# Patient Record
Sex: Male | Born: 1965 | Race: White | Hispanic: No | Marital: Single | Smoking: Never smoker
Health system: Southern US, Community
[De-identification: ages and names within clinical notes are randomized; demographics above are authoritative.]

## PROBLEM LIST (undated history)

## (undated) DIAGNOSIS — Q210 Ventricular septal defect: Secondary | ICD-10-CM

## (undated) DIAGNOSIS — G459 Transient cerebral ischemic attack, unspecified: Secondary | ICD-10-CM

## (undated) DIAGNOSIS — Q909 Down syndrome, unspecified: Secondary | ICD-10-CM

## (undated) DIAGNOSIS — I272 Pulmonary hypertension, unspecified: Secondary | ICD-10-CM

## (undated) DIAGNOSIS — I2510 Atherosclerotic heart disease of native coronary artery without angina pectoris: Secondary | ICD-10-CM

---

## 2017-10-16 ENCOUNTER — Inpatient Hospital Stay (HOSPITAL_BASED_OUTPATIENT_CLINIC_OR_DEPARTMENT_OTHER)
Admission: EM | Admit: 2017-10-16 | Discharge: 2017-10-20 | DRG: 291 | Disposition: A | Discharge status: Home / Self Care | Payer: Medicare Other | Attending: Internal Medicine | Admitting: Internal Medicine

## 2017-10-16 ENCOUNTER — Emergency Department (HOSPITAL_BASED_OUTPATIENT_CLINIC_OR_DEPARTMENT_OTHER): Payer: Medicare Other

## 2017-10-16 ENCOUNTER — Other Ambulatory Visit: Payer: Self-pay

## 2017-10-16 ENCOUNTER — Encounter (HOSPITAL_BASED_OUTPATIENT_CLINIC_OR_DEPARTMENT_OTHER): Payer: Self-pay | Admitting: *Deleted

## 2017-10-16 DIAGNOSIS — Z66 Do not resuscitate: Secondary | ICD-10-CM | POA: Diagnosis present

## 2017-10-16 DIAGNOSIS — F329 Major depressive disorder, single episode, unspecified: Secondary | ICD-10-CM | POA: Diagnosis present

## 2017-10-16 DIAGNOSIS — I959 Hypotension, unspecified: Secondary | ICD-10-CM | POA: Diagnosis present

## 2017-10-16 DIAGNOSIS — I509 Heart failure, unspecified: Secondary | ICD-10-CM

## 2017-10-16 DIAGNOSIS — I2510 Atherosclerotic heart disease of native coronary artery without angina pectoris: Secondary | ICD-10-CM | POA: Diagnosis present

## 2017-10-16 DIAGNOSIS — Q909 Down syndrome, unspecified: Secondary | ICD-10-CM

## 2017-10-16 DIAGNOSIS — R32 Unspecified urinary incontinence: Secondary | ICD-10-CM | POA: Diagnosis not present

## 2017-10-16 DIAGNOSIS — K219 Gastro-esophageal reflux disease without esophagitis: Secondary | ICD-10-CM | POA: Diagnosis present

## 2017-10-16 DIAGNOSIS — Z9981 Dependence on supplemental oxygen: Secondary | ICD-10-CM | POA: Diagnosis not present

## 2017-10-16 DIAGNOSIS — Z8673 Personal history of transient ischemic attack (TIA), and cerebral infarction without residual deficits: Secondary | ICD-10-CM

## 2017-10-16 DIAGNOSIS — E039 Hypothyroidism, unspecified: Secondary | ICD-10-CM | POA: Diagnosis present

## 2017-10-16 DIAGNOSIS — J9601 Acute respiratory failure with hypoxia: Secondary | ICD-10-CM | POA: Diagnosis not present

## 2017-10-16 DIAGNOSIS — I2783 Eisenmenger's syndrome: Secondary | ICD-10-CM | POA: Diagnosis present

## 2017-10-16 DIAGNOSIS — Q211 Atrial septal defect: Secondary | ICD-10-CM | POA: Diagnosis not present

## 2017-10-16 DIAGNOSIS — I110 Hypertensive heart disease with heart failure: Principal | ICD-10-CM | POA: Diagnosis present

## 2017-10-16 DIAGNOSIS — D696 Thrombocytopenia, unspecified: Secondary | ICD-10-CM | POA: Diagnosis present

## 2017-10-16 DIAGNOSIS — G4733 Obstructive sleep apnea (adult) (pediatric): Secondary | ICD-10-CM | POA: Diagnosis present

## 2017-10-16 DIAGNOSIS — I5033 Acute on chronic diastolic (congestive) heart failure: Secondary | ICD-10-CM | POA: Diagnosis present

## 2017-10-16 DIAGNOSIS — Z7901 Long term (current) use of anticoagulants: Secondary | ICD-10-CM

## 2017-10-16 DIAGNOSIS — J9600 Acute respiratory failure, unspecified whether with hypoxia or hypercapnia: Secondary | ICD-10-CM

## 2017-10-16 DIAGNOSIS — Z79899 Other long term (current) drug therapy: Secondary | ICD-10-CM | POA: Diagnosis not present

## 2017-10-16 DIAGNOSIS — J9690 Respiratory failure, unspecified, unspecified whether with hypoxia or hypercapnia: Secondary | ICD-10-CM

## 2017-10-16 DIAGNOSIS — J9620 Acute and chronic respiratory failure, unspecified whether with hypoxia or hypercapnia: Secondary | ICD-10-CM | POA: Diagnosis not present

## 2017-10-16 DIAGNOSIS — J811 Chronic pulmonary edema: Secondary | ICD-10-CM

## 2017-10-16 DIAGNOSIS — J810 Acute pulmonary edema: Secondary | ICD-10-CM | POA: Diagnosis not present

## 2017-10-16 DIAGNOSIS — Q210 Ventricular septal defect: Secondary | ICD-10-CM | POA: Diagnosis not present

## 2017-10-16 DIAGNOSIS — D72819 Decreased white blood cell count, unspecified: Secondary | ICD-10-CM | POA: Diagnosis present

## 2017-10-16 DIAGNOSIS — J9621 Acute and chronic respiratory failure with hypoxia: Secondary | ICD-10-CM | POA: Diagnosis present

## 2017-10-16 DIAGNOSIS — Z9989 Dependence on other enabling machines and devices: Secondary | ICD-10-CM | POA: Diagnosis not present

## 2017-10-16 DIAGNOSIS — I272 Pulmonary hypertension, unspecified: Secondary | ICD-10-CM | POA: Diagnosis present

## 2017-10-16 DIAGNOSIS — I361 Nonrheumatic tricuspid (valve) insufficiency: Secondary | ICD-10-CM | POA: Diagnosis not present

## 2017-10-16 DIAGNOSIS — R600 Localized edema: Secondary | ICD-10-CM | POA: Diagnosis not present

## 2017-10-16 HISTORY — DX: Transient cerebral ischemic attack, unspecified: G45.9

## 2017-10-16 HISTORY — DX: Atherosclerotic heart disease of native coronary artery without angina pectoris: I25.10

## 2017-10-16 HISTORY — DX: Ventricular septal defect: Q21.0

## 2017-10-16 HISTORY — DX: Pulmonary hypertension, unspecified: I27.20

## 2017-10-16 HISTORY — DX: Down syndrome, unspecified: Q90.9

## 2017-10-16 LAB — COMPREHENSIVE METABOLIC PANEL
ALT: 11 U/L (ref 0–44)
AST: 21 U/L (ref 15–41)
Albumin: 3.6 g/dL (ref 3.5–5.0)
Alkaline Phosphatase: 36 U/L — ABNORMAL LOW (ref 38–126)
Anion gap: 7 (ref 5–15)
BILIRUBIN TOTAL: 0.9 mg/dL (ref 0.3–1.2)
BUN: 20 mg/dL (ref 6–20)
CALCIUM: 7.9 mg/dL — AB (ref 8.9–10.3)
CO2: 29 mmol/L (ref 22–32)
CREATININE: 1.4 mg/dL — AB (ref 0.61–1.24)
Chloride: 106 mmol/L (ref 98–111)
GFR, EST NON AFRICAN AMERICAN: 57 mL/min — AB (ref >60)
Glucose, Bld: 95 mg/dL (ref 70–99)
POTASSIUM: 3.5 mmol/L (ref 3.5–5.1)
Sodium: 142 mmol/L (ref 135–145)
Total Protein: 6.6 g/dL (ref 6.5–8.1)

## 2017-10-16 LAB — CBC WITH DIFFERENTIAL/PLATELET
Basophils Absolute: 0 10*3/uL (ref 0.0–0.1)
Basophils Relative: 0 %
EOS PCT: 1 %
Eosinophils Absolute: 0 10*3/uL (ref 0.0–0.7)
HEMATOCRIT: 44.7 % (ref 39.0–52.0)
Hemoglobin: 14.7 g/dL (ref 13.0–17.0)
LYMPHS ABS: 0.7 10*3/uL (ref 0.7–4.0)
LYMPHS PCT: 18 %
MCH: 30.5 pg (ref 26.0–34.0)
MCHC: 32.9 g/dL (ref 30.0–36.0)
MCV: 92.7 fL (ref 78.0–100.0)
MONO ABS: 0.4 10*3/uL (ref 0.1–1.0)
MONOS PCT: 10 %
Neutro Abs: 2.6 10*3/uL (ref 1.7–7.7)
Neutrophils Relative %: 71 %
Platelets: 103 10*3/uL — ABNORMAL LOW (ref 150–400)
RBC: 4.82 MIL/uL (ref 4.22–5.81)
RDW: 16.9 % — AB (ref 11.5–15.5)
WBC: 3.7 10*3/uL — ABNORMAL LOW (ref 4.0–10.5)

## 2017-10-16 LAB — I-STAT VENOUS BLOOD GAS, ED
Acid-Base Excess: 2 mmol/L (ref 0.0–2.0)
BICARBONATE: 28.5 mmol/L — AB (ref 20.0–28.0)
O2 Saturation: 46 %
PCO2 VEN: 52.7 mmHg (ref 44.0–60.0)
TCO2: 30 mmol/L (ref 22–32)
pH, Ven: 7.341 (ref 7.250–7.430)
pO2, Ven: 27 mmHg — CL (ref 32.0–45.0)

## 2017-10-16 LAB — BRAIN NATRIURETIC PEPTIDE: B Natriuretic Peptide: 527.1 pg/mL — ABNORMAL HIGH (ref 0.0–100.0)

## 2017-10-16 LAB — TROPONIN I
Troponin I: 0.03 ng/mL (ref <0.03)
Troponin I: <0.03 ng/mL (ref <0.03)

## 2017-10-16 MED ORDER — TREPROSTINIL 0.6 MG/ML IN SOLN
24.0000 ug | Freq: Four times a day (QID) | RESPIRATORY_TRACT | Status: DC
  Administered 2017-10-16 – 2017-10-17 (×5): 24 ug via RESPIRATORY_TRACT

## 2017-10-16 MED ORDER — HEPARIN (PORCINE) IN NACL 100-0.45 UNIT/ML-% IJ SOLN
1250.0000 [IU]/h | INTRAMUSCULAR | Status: DC
  Administered 2017-10-16: 1200 [IU]/h via INTRAVENOUS
  Administered 2017-10-17: 1250 [IU]/h via INTRAVENOUS
  Filled 2017-10-16 (×2): qty 250

## 2017-10-16 MED ORDER — AMBRISENTAN 5 MG PO TABS
10.0000 mg | ORAL_TABLET | Freq: Every day | ORAL | Status: DC
  Administered 2017-10-17: 10 mg via ORAL

## 2017-10-16 MED ORDER — EPLERENONE 25 MG PO TABS
12.5000 mg | ORAL_TABLET | Freq: Every day | ORAL | Status: DC
  Administered 2017-10-17 – 2017-10-18 (×2): 12.5 mg via ORAL
  Filled 2017-10-16 (×2): qty 1

## 2017-10-16 MED ORDER — SERTRALINE HCL 50 MG PO TABS
50.0000 mg | ORAL_TABLET | Freq: Every day | ORAL | Status: DC
  Administered 2017-10-17 – 2017-10-20 (×4): 50 mg via ORAL
  Filled 2017-10-16 (×4): qty 1

## 2017-10-16 MED ORDER — SELEXIPAG 1600 MCG PO TABS
1600.0000 ug | ORAL_TABLET | Freq: Two times a day (BID) | ORAL | Status: DC
  Administered 2017-10-16 – 2017-10-20 (×8): 1600 ug via ORAL
  Filled 2017-10-16 (×9): qty 1

## 2017-10-16 MED ORDER — LEVOTHYROXINE SODIUM 88 MCG PO TABS
88.0000 ug | ORAL_TABLET | Freq: Every day | ORAL | Status: DC
  Administered 2017-10-17 – 2017-10-20 (×4): 88 ug via ORAL
  Filled 2017-10-16 (×5): qty 1

## 2017-10-16 MED ORDER — PANTOPRAZOLE SODIUM 40 MG PO TBEC
40.0000 mg | DELAYED_RELEASE_TABLET | Freq: Every day | ORAL | Status: DC
  Administered 2017-10-17 – 2017-10-20 (×4): 40 mg via ORAL
  Filled 2017-10-16 (×4): qty 1

## 2017-10-16 MED ORDER — NEBIVOLOL HCL 2.5 MG PO TABS
1.2500 mg | ORAL_TABLET | Freq: Every day | ORAL | Status: DC
  Administered 2017-10-17: 1.25 mg via ORAL
  Filled 2017-10-16 (×2): qty 1

## 2017-10-16 MED ORDER — FUROSEMIDE 10 MG/ML IJ SOLN
40.0000 mg | Freq: Once | INTRAMUSCULAR | Status: AC
  Administered 2017-10-16: 40 mg via INTRAVENOUS
  Filled 2017-10-16: qty 4

## 2017-10-16 MED ORDER — RIOCIGUAT 2.5 MG PO TABS
2.5000 mg | ORAL_TABLET | Freq: Three times a day (TID) | ORAL | Status: DC
  Administered 2017-10-16 – 2017-10-20 (×11): 2.5 mg via ORAL
  Filled 2017-10-16 (×11): qty 1

## 2017-10-16 MED ORDER — ALLOPURINOL 300 MG PO TABS
300.0000 mg | ORAL_TABLET | Freq: Every day | ORAL | Status: DC
  Administered 2017-10-17 – 2017-10-20 (×4): 300 mg via ORAL
  Filled 2017-10-16 (×4): qty 1

## 2017-10-16 NOTE — ED Notes (Signed)
ED Provider at bedside. 

## 2017-10-16 NOTE — Progress Notes (Signed)
ANTICOAGULATION CONSULT NOTE - Initial Consult  Pharmacy Consult for Heparin (Xarelto on hold) Indication: Hx of TIA with unrepaired VSD  No Known Allergies  Patient Measurements: Height: _0  (172.7 cm) Weight: 153 lb 7 oz (69.6 kg) IBW/kg (Calculated) : 68.4  Vital Signs: Temp: 98.4 F (36.9 C) (09/02 1724) Temp Source: Oral (09/02 1724) BP: 109/84 (09/02 2155) Pulse Rate: 75 (09/02 2155)  Labs: Recent Labs    10/16/17 1748  HGB 14.7  HCT 44.7  PLT 103*  CREATININE 1.40*  TROPONINI 0.03*    Estimated Creatinine Clearance: 61.1 mL/min (A) (by C-G formula based on SCr of 1.4 mg/dL (H)).  Medical History: Past Medical History:  Diagnosis Date  . Coronary artery stenosis   . Down syndrome   . Pulmonary hypertension ([place])   . TIA (transient ischemic attack)   . Ventricular septal defect    Assessment: 52 y/o M with hx down syndrome, pulm HTN. He is visiting [place] from [place] and presents with heart failure/resp failure. He is on Xarelto PTA for hx of TIA along with unrepaired VSD. Last dose of Xarelto was 9/1 ~2000. It has been >24 hours since the last dose, will plan to start Xarelto now. Will likely be using the aPTT to dose for the next 24-48 hours due to Xarelto influence on anti-Xa levels.   Goal of Therapy:  Heparin level 0.3-0.7 units/ml aPTT 66-102 seconds Monitor platelets by anticoagulation protocol: Yes   Plan:  Start heparin drip at 1200 units/hr 0800 HL/aPTT Daily CBC/HL/aPTT Monitor for bleeding   Narda Bonds 10/16/2017,10:57 PM

## 2017-10-16 NOTE — ED Provider Notes (Signed)
[facility]PART[facility] Provider Note   CSN: [id number] Arrival date & time: 10/16/17  1713     History   Chief Complaint Chief Complaint  Patient presents with  . Leg Swelling    HPI Jimmy King is a 52 y.o. male.  Patient is a 52 year old male with a history of Down syndrome, pulmonary hypertension, coronary artery disease, VSD that was never repaired and prior TIA who is on multiple medications for pulmonary hypertension, 4 L oxygen chronically presenting today with shortness of breath, hypoxia and color change.  Patient's brother is present with him and states that he usually lives in [place] but several times a year he goes to get him and he lives with him for a month at a time.  Brother states when he picked him up on Tuesday he did have swelling in his legs and slightly wet sounding cough.  They drove all night on Tuesday and got back to [place] on Thursday.  In the last 2 days he seemed to have worsening swelling in his leg and abdomen.  Today brother noted he could hear him breathing across the room and he appeared to be having more trouble breathing even though the patient will not complain of anything.  He has not had fever and the cough has only produced frothy sputum.  Patient does take Xarelto for his prior TIA and his known VSD.  He has not had any unilateral leg pain and the swelling has been equal and bilateral.  Brother states that he always runs a low blood pressure for as long as he can remember.  The history is provided by the patient and a caregiver.    Past Medical History:  Diagnosis Date  . Coronary artery stenosis   . Down syndrome   . Pulmonary hypertension ([place])   . TIA (transient ischemic attack)   . Ventricular septal defect     There are no active problems to display for this patient.   History reviewed. No pertinent surgical history.      Home Medications    Prior to Admission medications   Medication Sig Start Date  End Date Taking? Authorizing Provider  allopurinol (ZYLOPRIM) 300 MG tablet Take 300 mg by mouth daily.   Yes [provider]  ambrisentan (LETAIRIS) 10 MG tablet Take by mouth daily.   Yes [provider]  eplerenone (INSPRA) 25 MG tablet Take 12.5 mg by mouth daily.   Yes [provider]  levothyroxine (SYNTHROID, LEVOTHROID) 88 MCG tablet Take 88 mcg by mouth daily before breakfast.   Yes [provider]  nebivolol (BYSTOLIC) 2.5 MG tablet Take 2.5 mg by mouth daily.   Yes [provider]  pantoprazole (PROTONIX) 40 MG tablet Take 40 mg by mouth daily.   Yes [provider]  Riociguat (ADEMPAS) 2.5 MG TABS Take by mouth 3 (three) times daily.   Yes [provider]  Rivaroxaban (XARELTO) 15 MG TABS tablet Take 15 mg by mouth every evening.   Yes [provider]  Selexipag (UPTRAVI) 1600 MCG TABS Take by mouth 2 (two) times daily.   Yes [provider]  sertraline (ZOLOFT) 50 MG tablet Take 50 mg by mouth daily.   Yes [provider]    Family History History reviewed. No pertinent family history.  Social History Social History   Tobacco Use  . Smoking status: Never Smoker  . Smokeless tobacco: Never Used  Substance Use Topics  . Alcohol use: Never  Frequency: Never  . Drug use: Never     Allergies   Patient has no known allergies.   Review of Systems Review of Systems  All other systems reviewed and are negative.    Physical Exam Updated Vital Signs BP (!) 88/52   Pulse 61   Temp 98.4 F (36.9 C) (Oral)   Resp 12   Wt 70.7 kg   SpO2 (!) 89%   Physical Exam  Constitutional: He is oriented to person, place, and time. He appears well-developed and well-nourished. No distress.  HENT:  Head: Normocephalic and atraumatic.  Mouth/Throat: Oropharynx is clear and moist.  Typical facies related to Down syndrome.  Tried blood at the lip  Eyes: Pupils are equal, round, and reactive  to light. Conjunctivae and EOM are normal.  Neck: Normal range of motion. Neck supple.  Cardiovascular: Normal rate, regular rhythm and intact distal pulses.  Murmur heard. Pulmonary/Chest: Accessory muscle usage present. Tachypnea noted. No respiratory distress. He has wheezes. He has rales.  Abdominal: Soft. He exhibits distension. There is no tenderness. There is no rebound and no guarding.  Musculoskeletal: Normal range of motion. He exhibits edema. He exhibits no tenderness.  3+ pitting edema bilaterally to the knee  Neurological: He is alert and oriented to person, place, and time.  Skin: Skin is warm and dry. No rash noted. No erythema.  Cyanotic in the bilateral upper and lower extremities as well as cyanosis around the lips  Psychiatric: He has a normal mood and affect. His behavior is normal.  Awake and alert and cooperative  Nursing note and vitals reviewed.    ED Treatments / Results  Labs (all labs ordered are listed, but only abnormal results are displayed) Labs Reviewed  CBC WITH DIFFERENTIAL/PLATELET - Abnormal; Notable for the following components:      Result Value   WBC 3.7 (*)    RDW 16.9 (*)    Platelets 103 (*)    All other components within normal limits  COMPREHENSIVE METABOLIC PANEL - Abnormal; Notable for the following components:   Creatinine, Ser 1.40 (*)    Calcium 7.9 (*)    Alkaline Phosphatase 36 (*)    GFR calc non Af Amer 57 (*)    All other components within normal limits  BRAIN NATRIURETIC PEPTIDE - Abnormal; Notable for the following components:   B Natriuretic Peptide 527.1 (*)    All other components within normal limits  TROPONIN I - Abnormal; Notable for the following components:   Troponin I 0.03 (*)    All other components within normal limits  I-STAT VENOUS BLOOD GAS, ED - Abnormal; Notable for the following components:   pO2, Ven 27.0 (*)    Bicarbonate 28.5 (*)    All other components within normal limits    EKG EKG  Interpretation  Date/Time:  Monday October 16 2017 17:41:09 EDT Ventricular Rate:  76 PR Interval:    QRS Duration: 102 QT Interval:  457 QTC Calculation: 494 R Axis:   160 Text Interpretation:  Sinus rhythm Ventricular premature complex Borderline prolonged PR interval Left atrial enlargement Right axis deviation Low voltage, extremity leads Abnormal R-wave progression, early transition Borderline prolonged QT interval No previous tracing Confirmed by Blanchie Dessert [phone]) on 10/16/2017 6:16:01 PM   Radiology Dg Chest Portable 1 View  Result Date: 10/16/2017 CLINICAL DATA:  Shortness of breath and leg swelling. EXAM: PORTABLE CHEST 1 VIEW COMPARISON:  None. FINDINGS: Cardiomegaly. Increased interstitial opacities bilaterally. Atelectasis in the right base.  Probable small effusions. No other acute abnormalities. IMPRESSION: The constellation of findings is most consistent with cardiomegaly, small effusions, and pulmonary edema. Electronically Signed   By: Dorise Bullion III M.D   On: 10/16/2017 18:01    Procedures Procedures (including critical care time)  Medications Ordered in ED Medications  furosemide (LASIX) injection 40 mg (40 mg Intravenous Given 10/16/17 1910)     Initial Impression / Assessment and Plan / ED Course  I have reviewed the triage vital signs and the nursing notes.  Pertinent labs & imaging results that were available during my care of the patient were reviewed by me and considered in my medical decision making (see chart for details).     52 year old patient with Down syndrome unrepaired VSD with a history of pulmonary hypertension on 4 L of oxygen chronically presenting today with hypoxia and evidence of fluid overload and tachypnea.  Patient has a bilateral lower extremity edema as well as abdominal distention.  He has rales and wheezes on chest exam.  X-ray shows evidence of cardiomegaly and pulmonary edema.  BNP elevated at 530.  Patient's renal function  is intact with a creatinine of 1.4 normal electrolytes.  VBG shows a compensated respiratory acidosis.  Troponin is mildly elevated at 0.03.  Patient was started on BiPAP with improvement in oxygen saturation and work of breathing.  Oxygen saturation remains about 92%.  Work of breathing significantly improved.  Brother states that patient's blood pressure is always in the 80s and 90s as long as he can remember.  Here blood pressure has been between the high 17T and 199 systolic.  On reevaluation patient is awake alert and looking much more comfortable.  Cyanosis has resolved.  Initially on evaluation on his 4 L of oxygen patient's O2 sat was 79%.  Patient will be given IV Lasix.  At this time low suspicion for PE or infection.  Patient does take Xarelto directly. Will discuss with cardiology.   Spoke with Dr. Johnsie Cancel and he recommended ICU for end stage pulm hypertension.  CRITICAL CARE Performed by: Khristian Phillippi Total critical care time: 30 minutes Critical care time was exclusive of separately billable procedures and treating other patients. Critical care was necessary to treat or prevent imminent or life-threatening deterioration. Critical care was time spent personally by me on the following activities: development of treatment plan with patient and/or surrogate as well as nursing, discussions with consultants, evaluation of patient's response to treatment, examination of patient, obtaining history from patient or surrogate, ordering and performing treatments and interventions, ordering and review of laboratory studies, ordering and review of radiographic studies, pulse oximetry and re-evaluation of patient's condition.   Final Clinical Impressions(s) / ED Diagnoses   Final diagnoses:  Pulmonary hypertension ([place])  Acute on chronic congestive heart failure, unspecified heart failure type (HCC)  Hypotension, unspecified hypotension type    ED Discharge Orders    None       Blanchie Dessert, MD 10/16/17 2009

## 2017-10-16 NOTE — ED Notes (Signed)
RT at bedside, placed on BiPap

## 2017-10-16 NOTE — Progress Notes (Signed)
Patient just arrived to unit. RT placed patient on BIPAP. Patient tolerating well at this time. RT will continue to monitor as needed.

## 2017-10-16 NOTE — ED Notes (Signed)
Date and time results received: 10/16/17 6:39 PM   Test: Troponin Critical Value: 0.03  Name of Provider Notified: [place]  Orders Received? Or Actions Taken?: MD notified

## 2017-10-16 NOTE — ED Triage Notes (Signed)
Pt visiting from [place] with his brother. Pts brother reports that pts legs are swollen, distended abdomen and 'wet breathing'.

## 2017-10-16 NOTE — ED Notes (Signed)
Playing a game on cell phone , brother at bedside

## 2017-10-16 NOTE — H&P (Addendum)
Jimmy King  CBJ:[id number] DOB: 02/19/1965 DOA: 10/16/2017 PCP: System, Pcp Not In    LOS: 0 days   Reason for Consult / Chief Complaint:  Hypoxic respiratory failure and hypervolemia   Consulting MD and date of consult:  Dr. Maryan Rued 10/16/2017  HPI/summary of hospital stay:  HPI obtained from medical chart review, patient's brother, and medical records brought by patient's brother from [place], 65.    52 year old male visiting brother from [place] with [place] of Down's syndrome, pulmonary hypertension on home O2 4L, CAD, VSD (never repaired), OSA on home BiPAP, hypothyroidism, gout, and TIA (on Xarelto) who presented to [facility] ED with complaints of swollen legs, distended abdomen, wet cough, and shortness of breath.  His brother picked him up in [place] on Tuesday and noted he had some swelling and wet cough at that time but since has progressed.  His brother also states patient chronically has low blood pressure with SBP in the 80-90's.  Denies fever.  States patient never complains.  No reports of bleeding except for busted lip in which his brother states wont heal because patient keeps picking at.  Patient's is primarily managed by Dr. Leane Para in [place], [place] [phone]).   In the ER, he was noted to be tachypneic, afebrile, above average SBP for patient, sats 79% on 4L, and physical exam with evidence of fluid overload.  Labs noted for WBC 3.7, Hgb 14.7, Plts 103, sCr 1.40 (baseline 1.5), BNP 527, troponin 0.03, CXR showing pulmonary edema and cardiomegaly.   VBG noted at 7.341/ 52.7/ 27/ 28.5.  He was given lasix and placed on BiPAP with improvement in work of breathing and saturation.  He is to be transferred to [facility] for further evaluation and management, PCCM to admit.   Subjective  No complaints per patient Arrived on 10/5 BiPAP, 50%  Assessment & Plan:  Severe Pulmonary Hypertension (presumed WHO group 1) with acute volume overload  Hx VSD (not repaired) - on xarelto, letarisis  50m qd, Tyvaso, riociguat 2.521mBID, and selexipag 160055mBID - s/p lasix 41m35m ER with good UOP, 1 occurrence of incontinence - LFTs wnl  - brother thinks patient was taken off lasxi 20mg64mly around 3 months ago secondary to hypotension P:  Consult cardiology/ HF  Trend troponin and EKG TTE in am  Hold Xarelto (last dose 9/1), pharmacy to bridge to heparin Gentle diuresis daily since patient will be RV dependent; per Cards  Strict I/O's/ daily wts   Add on mag and phos, trend renal function Resume letarisis, Tyvaso, riociguat, and selexipag  Acute on chronic hypoxic respiratory failure related to pulmonary hypertension and hypervolemia OSA on home HS bipap - baseline home O2 of 4L  P:  Continue BiPAP as needed, and mandatory qHS Goal sats 90-92%  Reported Hypotension - reports that patient runs on the lower side, SBP 80-90 - has been normotensive here P:  Monitor in ICU Resume home bystolic 1.25 6.94aily with holding parameters and eplerenone  Leukopenia  Thrombocytopenia  P:  Trend CBC SCDs Holding Xarelto, heparin gtt per pharmacy    Hypothyroidism  P:  Synthroid daily Check TSH  Depression P:  Resume home zoloft  Best Practice / Goals of Care / Disposition.   DVT prophylaxis: SCDs, heparin  GI prophylaxis: Protonix  Diet: NPO, except meds Mobility: bedrest Code Status: DNR Family Communication: Patient's brother, [name]-[phone] at bedside  Disposition / Summary of Today's Plan 10/16/17   Admit to  ICU for stabilization   Consultants: date of consult/date signed off (if applicable)/final recs  Cardiology/ HF  Procedures:  Significant Diagnostic Tests: 9/2 CXR>> The constellation of findings is most consistent with cardiomegaly, small effusions, and pulmonary edema.  Micro Data: 9/2 MRSA PCR >>  Antimicrobials:  none  Objective    Examination: General:  Adult male with Down syndrome features sitting in bed in NAD on  bipap HEENT: MM pink/dry, dried blood to lips, pupils 4/reactive, anicteric, +JVD Neuro: Alert, f/c, MAE CV: rrr, +murmur PULM: even/non-labored, lungs bilaterally coarse, faint exp wheeze, scattered rales  YK:DXIP, distended, non-tender, bs active  Extremities: warm/dry, +3 BLE edema - no asymmetry noted  Skin: no rashes   Blood pressure 114/78, pulse 70, temperature 98.4 F (36.9 C), temperature source Oral, resp. rate 15, weight 70.7 kg, SpO2 91 %.    FiO2 (%):  [50 %] 50 %   Intake/Output Summary (Last 24 hours) at 10/16/2017 2021 Last data filed at 10/16/2017 2012 Gross per 24 hour  Intake -  Output 375 ml  Net -375 ml   Filed Weights   10/16/17 1724  Weight: 70.7 kg     Labs    CBC: Recent Labs  Lab 10/16/17 1748  WBC 3.7*  NEUTROABS 2.6  HGB 14.7  HCT 44.7  MCV 92.7  PLT 382*   Basic Metabolic Panel: Recent Labs  Lab 10/16/17 1748  NA 142  K 3.5  CL 106  CO2 29  GLUCOSE 95  BUN 20  CREATININE 1.40*  CALCIUM 7.9*   GFR: CrCl cannot be calculated (Unknown ideal weight.). Recent Labs  Lab 10/16/17 1748  WBC 3.7*   Liver Function Tests: Recent Labs  Lab 10/16/17 1748  AST 21  ALT 11  ALKPHOS 36*  BILITOT 0.9  PROT 6.6  ALBUMIN 3.6   No results for input(s): LIPASE, AMYLASE in the last 168 hours. No results for input(s): AMMONIA in the last 168 hours. ABG    Component Value Date/Time   HCO3 28.5 (H) 10/16/2017 1754   TCO2 30 10/16/2017 1754   O2SAT 46.0 10/16/2017 1754    Coagulation Profile: No results for input(s): INR, PROTIME in the last 168 hours. Cardiac Enzymes: Recent Labs  Lab 10/16/17 1748  TROPONINI 0.03*   HbA1C: No results found for: HGBA1C CBG: No results for input(s): GLUCAP in the last 168 hours.   Review of Systems:   POSITIVES IN BOLD  Gen: Denies fever, chills, weight change, fatigue HEENT: Denies vision changes, sinus congestion, sore throat, neck stiffness, dysphagia PULM: Denies shortness of breath,  cough, sputum production, hemoptysis, wheezing CV: Denies chest pain, edema, orthopnea, paroxysmal nocturnal dyspnea, palpitations GI: Denies abdominal pain, nausea, vomiting, diarrhea, change in bowel habits GU: Denies dysuria, hematuria, polyuria, oliguria Endocrine: Denies hot or cold intolerance, polyuria, polyphagia or appetite change Derm: Denies rash, or skin change Heme: Denies easy bruising, bleeding gums, bleeding other than on lips  Neuro: Denies headache, numbness, weakness, slurred speech, loss of memory or consciousness  Past medical history  He,  has a past medical history of Coronary artery stenosis, Down syndrome, Pulmonary hypertension ([place]), TIA (transient ischemic attack), and Ventricular septal defect.   Surgical History   History reviewed. No pertinent surgical history.   Social History   Social History   Socioeconomic History  . Marital status: Single    Spouse name: Not on file  . Number of children: Not on file  . Years of education: Not on file  .  Highest education level: Not on file  Occupational History  . Not on file  Social Needs  . Financial resource strain: Not on file  . Food insecurity:    Worry: Not on file    Inability: Not on file  . Transportation needs:    Medical: Not on file    Non-medical: Not on file  Tobacco Use  . Smoking status: Never Smoker  . Smokeless tobacco: Never Used  Substance and Sexual Activity  . Alcohol use: Never    Frequency: Never  . Drug use: Never  . Sexual activity: Never  Lifestyle  . Physical activity:    Days per week: Not on file    Minutes per session: Not on file  . Stress: Not on file  Relationships  . Social connections:    Talks on phone: Not on file    Gets together: Not on file    Attends religious service: Not on file    Active member of club or organization: Not on file    Attends meetings of clubs or organizations: Not on file    Relationship status: Not on file  . Intimate partner  violence:    Fear of current or ex partner: Not on file    Emotionally abused: Not on file    Physically abused: Not on file    Forced sexual activity: Not on file  Other Topics Concern  . Not on file  Social History Narrative  . Not on file  ,  reports that he has never smoked. He has never used smokeless tobacco. He reports that he does not drink alcohol or use drugs.   Family history   His family history is not on file.   Allergies No Known Allergies  Home meds  Prior to Admission medications   Medication Sig Start Date End Date Taking? Authorizing Provider  allopurinol (ZYLOPRIM) 300 MG tablet Take 300 mg by mouth daily.   Yes [provider]  ambrisentan (LETAIRIS) 10 MG tablet Take by mouth daily.   Yes [provider]  eplerenone (INSPRA) 25 MG tablet Take 12.5 mg by mouth daily.   Yes [provider]  levothyroxine (SYNTHROID, LEVOTHROID) 88 MCG tablet Take 88 mcg by mouth daily before breakfast.   Yes [provider]  nebivolol (BYSTOLIC) 2.5 MG tablet Take 2.5 mg by mouth daily.   Yes [provider]  pantoprazole (PROTONIX) 40 MG tablet Take 40 mg by mouth daily.   Yes [provider]  Riociguat (ADEMPAS) 2.5 MG TABS Take by mouth 3 (three) times daily.   Yes [provider]  Rivaroxaban (XARELTO) 15 MG TABS tablet Take 15 mg by mouth every evening.   Yes [provider]  Selexipag (UPTRAVI) 1600 MCG TABS Take by mouth 2 (two) times daily.   Yes [provider]  sertraline (ZOLOFT) 50 MG tablet Take 50 mg by mouth daily.   Yes [provider]    Kennieth Rad, AGACNP-BC [facility] Pgr: [phone] or if no answer [phone] 10/16/2017, 10:24 PM

## 2017-10-16 NOTE — Consult Note (Signed)
CARDIOLOGY CONSULT NOTE   Referring Physician: Dr. Elwyn Reach Primary Physician: N/A Primary Cardiologist: N/A (followed by pulmonology in [place]) Reason for Consultation: pulmonary hypertension   HPI: Jimmy King is a 52 y.o. 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   Due to the patient's cognitive impairment at baseline, history is obtained from the patient's brother who is currently at the patient's bedside.  Briefly, the patient has a long-standing history of congenital cardiac disease and pulmonary hypertension.  He was discovered to have a membranous VSD and ASD shortly after birth, however neither of these were surgically corrected.  He has had severe pulmonary hypertension for many years and has been followed by a pulmonologist in [place], [place].  He has been on for pulmonary vasodilator medications for several years now and has done reasonably well on these medications.  The patient's brother notes that in the past a wean from 1 of his pulmonary vasodilators has been attempted, however this was unsuccessful and thus he has been maintained on stable doses since that time.  The patient lives by himself in [place], however he does have a caretaker that comes to visit and help him occasionally.  His brother is a Careers information officer here in the [facility] area and will have the patient come to stay with him sometimes for 1 to 2 months at a time so that he can take care of him.  During the patient's current visit, the patient's brother noticed that his legs were looking significantly more swollen than usual and his abdomen had become distended as well.  Notably, the patient did not report any symptoms at all, however the patient's brother states that the patient never reports any symptoms.  He does use continuous supplemental oxygen at home, typically  4 L nasal cannula.  He additionally wears BiPAP at night.  The patient's brother is unsure as to how reliably the patient has been taking his medications given that he does not have 24-hour care at home.  On arrival to the ED, the patient was noted to be severely hypoxemic with oxygen saturations in the 70s.  His baseline oxygen saturation at home on 4 L of oxygen by nasal cannula is typically in the mid 80s.  Chest x-ray revealed significant bilateral pulmonary edema.  He was placed on BiPAP and sent to [facility] for further evaluation and treatment of his severe pulmonary hypertension.   Review of Systems:     Cardiac Review of Systems: {Y] = yes _0  = no  Chest Pain [    ]  Resting SOB [   ] Exertional SOB  [  ]  Orthopnea [  ]   Pedal Edema [   ]    Palpitations [  ] Syncope  [  ]   Presyncope [   ]  General Review of Systems: [Y] = yes [  ]=no Constitional: recent weight change [  ]; anorexia [  ]; fatigue [  ]; nausea [  ]; night sweats [  ]; fever [  ]; or chills [  ];  Eyes : blurred vision [  ]; diplopia [   ]; vision changes [  ];  Amaurosis fugax[  ]; Resp: cough [  ];  wheezing[  ];  hemoptysis[  ];  PND [  ];  GI:  gallstones[  ], vomiting[  ];  dysphagia[  ]; melena[  ];  hematochezia [  ]; heartburn[  ];   GU: kidney stones [  ]; hematuria[  ];   dysuria [  ];  nocturia[  ]; incontinence [  ];             Skin: rash, swelling[  ];, hair loss[  ];  peripheral edema[  ];  or itching[  ]; Musculosketetal: myalgias[  ];  joint swelling[  ];  joint erythema[  ];  joint pain[  ];  back pain[  ];  Heme/Lymph: bruising[  ];  bleeding[  ];  anemia[  ];  Neuro: TIA[  ];  headaches[  ];  stroke[  ];  vertigo[  ];  seizures[  ];   paresthesias[  ];  difficulty walking[  ];  Psych:depression[  ]; anxiety[  ];  Endocrine: diabetes[  ];  thyroid dysfunction[  ];  Other:  Past Medical History:  Diagnosis Date  . Coronary  artery stenosis   . Down syndrome   . Pulmonary hypertension ([place])   . TIA (transient ischemic attack)   . Ventricular septal defect     Medications Prior to Admission  Medication Sig Dispense Refill  . allopurinol (ZYLOPRIM) 300 MG tablet Take 300 mg by mouth daily.    Marland Kitchen ambrisentan (LETAIRIS) 10 MG tablet Take by mouth daily.    Marland Kitchen eplerenone (INSPRA) 25 MG tablet Take 12.5 mg by mouth daily.    Marland Kitchen levothyroxine (SYNTHROID, LEVOTHROID) 88 MCG tablet Take 88 mcg by mouth daily before breakfast.    . nebivolol (BYSTOLIC) 2.5 MG tablet Take 0.625 mg by mouth daily.     . pantoprazole (PROTONIX) 40 MG tablet Take 40 mg by mouth daily.    . phenol (CHLORASEPTIC) 1.4 % LIQD Use as directed 1 spray in the mouth or throat as needed for throat irritation / pain.    . Riociguat (ADEMPAS) 2.5 MG TABS Take by mouth 3 (three) times daily.    . Rivaroxaban (XARELTO) 15 MG TABS tablet Take 15 mg by mouth every evening.    . Selexipag (UPTRAVI) 1600 MCG TABS Take by mouth 2 (two) times daily.    . sertraline (ZOLOFT) 50 MG tablet Take 50 mg by mouth daily.    . Treprostinil (TYVASO) 0.6 MG/ML SOLN Inhale 24 mcg into the lungs 4 (four) times daily.       Derrill Memo ON 10/17/2017] allopurinol  300 mg Oral Daily  . [START ON 10/17/2017] ambrisentan  10 mg Oral Daily  . [START ON 10/17/2017] eplerenone  12.5 mg Oral Daily  . [START ON 10/17/2017] levothyroxine  88 mcg Oral QAC breakfast  . [START ON 10/17/2017] nebivolol  1.25 mg Oral Daily  . [START ON 10/17/2017] pantoprazole  40 mg Oral Daily  . Riociguat  2.5 mg Oral TID  . Selexipag  1,600 mcg Oral BID  . [START ON 10/17/2017] sertraline  50 mg Oral Daily  . Treprostinil  24 mcg Inhalation QID    Infusions: . heparin      No Known Allergies  Social History   Socioeconomic History  . Marital status: Single    Spouse name: Not on file  . Number  of children: Not on file  . Years of education: Not on file  . Highest education level: Not on file    Occupational History  . Not on file  Social Needs  . Financial resource strain: Not on file  . Food insecurity:    Worry: Not on file    Inability: Not on file  . Transportation needs:    Medical: Not on file    Non-medical: Not on file  Tobacco Use  . Smoking status: Never Smoker  . Smokeless tobacco: Never Used  Substance and Sexual Activity  . Alcohol use: Never    Frequency: Never  . Drug use: Never  . Sexual activity: Never  Lifestyle  . Physical activity:    Days per week: Not on file    Minutes per session: Not on file  . Stress: Not on file  Relationships  . Social connections:    Talks on phone: Not on file    Gets together: Not on file    Attends religious service: Not on file    Active member of club or organization: Not on file    Attends meetings of clubs or organizations: Not on file    Relationship status: Not on file  . Intimate partner violence:    Fear of current or ex partner: Not on file    Emotionally abused: Not on file    Physically abused: Not on file    Forced sexual activity: Not on file  Other Topics Concern  . Not on file  Social History Narrative  . Not on file    History reviewed. No pertinent family history.  PHYSICAL EXAM: Vitals:   10/16/17 2300 10/16/17 2328  BP: 94/69 94/69  Pulse: 68 68  Resp: 14 14  Temp:    SpO2: 91% 96%     Intake/Output Summary (Last 24 hours) at 10/16/2017 2333 Last data filed at 10/16/2017 2300 Gross per 24 hour  Intake -  Output 1175 ml  Net -1175 ml    General: Comfortable, wearing BiPAP mask HEENT: normal Neck: supple.  JVP significantly elevated to greater than 14 cm of water.  Carotids 2+ bilat; no bruits. No lymphadenopathy or thryomegaly appreciated. Cor: PMI displaced rightward. Regular rate & rhythm.  Harsh, 4/6 systolic murmur heard throughout the precordium with palpable thrill. Lungs: Coarse crackles throughout the bilateral lung fields Abdomen: soft, nontender, moderately  distended. No hepatosplenomegaly. No bruits or masses. Good bowel sounds. Extremities: no cyanosis, clubbing, rash; 2+ pitting edema past the bilateral knees Neuro: Not assessed  ECG: Normal sinus rhythm, low voltage, right atrial abnormality, borderline right axis deviation, nonspecific ST and T wave changes  Results for orders placed or performed during the hospital encounter of 10/16/17 (from the past 24 hour(s))  CBC with Differential/Platelet     Status: Abnormal   Collection Time: 10/16/17  5:48 PM  Result Value Ref Range   WBC 3.7 (L) 4.0 - 10.5 K/uL   RBC 4.82 4.22 - 5.81 MIL/uL   Hemoglobin 14.7 13.0 - 17.0 g/dL   HCT 44.7 39.0 - 52.0 %   MCV 92.7 78.0 - 100.0 fL   MCH 30.5 26.0 - 34.0 pg   MCHC 32.9 30.0 - 36.0 g/dL   RDW 16.9 (H) 11.5 - 15.5 %   Platelets 103 (L) 150 - 400 K/uL   Neutrophils Relative % 71 %   Neutro Abs 2.6 1.7 - 7.7 K/uL   Lymphocytes Relative 18 %   Lymphs Abs 0.7 0.7 -  4.0 K/uL   Monocytes Relative 10 %   Monocytes Absolute 0.4 0.1 - 1.0 K/uL   Eosinophils Relative 1 %   Eosinophils Absolute 0.0 0.0 - 0.7 K/uL   Basophils Relative 0 %   Basophils Absolute 0.0 0.0 - 0.1 K/uL   Smear Review MORPHOLOGY UNREMARKABLE   Comprehensive metabolic panel     Status: Abnormal   Collection Time: 10/16/17  5:48 PM  Result Value Ref Range   Sodium 142 135 - 145 mmol/L   Potassium 3.5 3.5 - 5.1 mmol/L   Chloride 106 98 - 111 mmol/L   CO2 29 22 - 32 mmol/L   Glucose, Bld 95 70 - 99 mg/dL   BUN 20 6 - 20 mg/dL   Creatinine, Ser 1.40 (H) 0.61 - 1.24 mg/dL   Calcium 7.9 (L) 8.9 - 10.3 mg/dL   Total Protein 6.6 6.5 - 8.1 g/dL   Albumin 3.6 3.5 - 5.0 g/dL   AST 21 15 - 41 U/L   ALT 11 0 - 44 U/L   Alkaline Phosphatase 36 (L) 38 - 126 U/L   Total Bilirubin 0.9 0.3 - 1.2 mg/dL   GFR calc non Af Amer 57 (L) >60 mL/min   GFR calc Af Amer >60 >60 mL/min   Anion gap 7 5 - 15  Brain natriuretic peptide     Status: Abnormal   Collection Time: 10/16/17  5:48 PM    Result Value Ref Range   B Natriuretic Peptide 527.1 (H) 0.0 - 100.0 pg/mL  Troponin I     Status: Abnormal   Collection Time: 10/16/17  5:48 PM  Result Value Ref Range   Troponin I 0.03 (HH) <0.03 ng/mL  I-Stat Venous Blood Gas, ED (order at [facility] and [facility] only)     Status: Abnormal   Collection Time: 10/16/17  5:54 PM  Result Value Ref Range   pH, Ven 7.341 7.250 - 7.430   pCO2, Ven 52.7 44.0 - 60.0 mmHg   pO2, Ven 27.0 (LL) 32.0 - 45.0 mmHg   Bicarbonate 28.5 (H) 20.0 - 28.0 mmol/L   TCO2 30 22 - 32 mmol/L   O2 Saturation 46.0 %   Acid-Base Excess 2.0 0.0 - 2.0 mmol/L   Patient temperature HIDE    Collection site IV START    Drawn by Nurse    Sample type VENOUS    Comment VALUES EXPECTED, NO REPEAT    Dg Chest Portable 1 View  Result Date: 10/16/2017 CLINICAL DATA:  Shortness of breath and leg swelling. EXAM: PORTABLE CHEST 1 VIEW COMPARISON:  None. FINDINGS: Cardiomegaly. Increased interstitial opacities bilaterally. Atelectasis in the right base. Probable small effusions. No other acute abnormalities. IMPRESSION: The constellation of findings is most consistent with cardiomegaly, small effusions, and pulmonary edema. Electronically Signed   By: Dorise Bullion III M.D   On: 10/16/2017 18:01    ASSESSMENT: Jimmy King is a 52 y.o. 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   The patient is significantly volume overloaded on exam.  Per the patient's brother's report, he had apparently been taken off of his Lasix several months ago given his borderline hypotension with systolic pressures ranging from the 80s to 90s.  I suspect that this is the driving cause for his current presentation.  A pulmonary embolism must also be considered as a potential cause for acute worsening hypoxemia.  I believe  this is unlikely the case in this patient given  that he is on chronic oral anticoagulation (albeit with questionable adherence) and given the fact that the patient's right heart is severely dysfunctional, any significant pulmonary embolism would likely result in devastating clinical consequences.    For now, this patient's care should be focused on aggressive IV diuresis as tolerated from a hemodynamic standpoint.  Although the patient does have borderline hypotension at rest, he currently is showing no physical exam or laboratory evidence of shock, cardiogenic or otherwise.  Once he is closer to euvolemia, it may be reasonable to assess his cardiac hemodynamics with a right heart cath on his current doses of pulmonary vasodilator therapy.  PLAN/DISCUSSION: - Aggressive diuresis with IV Lasix to achieve net fluid balance of -1 to 2 L daily - Close monitoring of electrolytes - Recommend checking lactate once to establish baseline - Order TTE to evaluate right heart size and function - Recommend continuing all of patient's pulmonary vasodilator therapy at his home doses - Agree with holding rivaroxaban and starting heparin drip for history of systemic embolism per pharmacy protocol - Agree with continuing home doses of eplerenone and nebivolol - May consider right heart cath for invasive hemodynamic monitoring once patient is closer to euvolemia, depending on patient's inpatient clinical course  Marcie Mowers, MD Cardiology Fellow, PGY-6

## 2017-10-16 NOTE — ED Notes (Signed)
ED MD aware of BP

## 2017-10-16 NOTE — ED Notes (Signed)
Report given to [facility] with [facility]

## 2017-10-16 NOTE — ED Notes (Signed)
Report given to [facility], receiving nurse at [facility]

## 2017-10-16 NOTE — Progress Notes (Signed)
eLink Physician-Brief Progress Note Patient Name: Rhylen Shaheen DOB: 15-Aug-1965 MRN: [id number]   Date of Service  10/16/2017  HPI/Events of Note  52 yo male with PMH of Down's Syndrome and unrepaired VSD with severe pulmonary hypertension and Eisenmenger's Syndrome. Now in heart failure and on comfortable on BiPAP with sat = 90-91% and RR = 12. PCCM asked to admit. VSS.   eICU Interventions  Will inform PCCM ground team of patient's arrival. No new orders at thsi stime.      Intervention Category Evaluation Type: New Patient Evaluation  Lysle Dingwall 10/16/2017, 9:45 PM

## 2017-10-17 ENCOUNTER — Inpatient Hospital Stay (HOSPITAL_COMMUNITY): Payer: Medicare Other

## 2017-10-17 ENCOUNTER — Other Ambulatory Visit: Payer: Self-pay

## 2017-10-17 DIAGNOSIS — I361 Nonrheumatic tricuspid (valve) insufficiency: Secondary | ICD-10-CM

## 2017-10-17 DIAGNOSIS — I959 Hypotension, unspecified: Secondary | ICD-10-CM

## 2017-10-17 LAB — CBC
HEMATOCRIT: 42.7 % (ref 39.0–52.0)
Hemoglobin: 13.7 g/dL (ref 13.0–17.0)
MCH: 30 pg (ref 26.0–34.0)
MCHC: 32.1 g/dL (ref 30.0–36.0)
MCV: 93.6 fL (ref 78.0–100.0)
Platelets: 109 10*3/uL — ABNORMAL LOW (ref 150–400)
RBC: 4.56 MIL/uL (ref 4.22–5.81)
RDW: 16.4 % — AB (ref 11.5–15.5)
WBC: 3.1 10*3/uL — AB (ref 4.0–10.5)

## 2017-10-17 LAB — HIV ANTIBODY (ROUTINE TESTING W REFLEX): HIV SCREEN 4TH GENERATION: NONREACTIVE

## 2017-10-17 LAB — RENAL FUNCTION PANEL
Albumin: 2.8 g/dL — ABNORMAL LOW (ref 3.5–5.0)
Anion gap: 6 (ref 5–15)
BUN: 18 mg/dL (ref 6–20)
CHLORIDE: 106 mmol/L (ref 98–111)
CO2: 31 mmol/L (ref 22–32)
Calcium: 7.6 mg/dL — ABNORMAL LOW (ref 8.9–10.3)
Creatinine, Ser: 1.46 mg/dL — ABNORMAL HIGH (ref 0.61–1.24)
GFR calc Af Amer: >60 mL/min (ref >60)
GFR, EST NON AFRICAN AMERICAN: 54 mL/min — AB (ref >60)
Glucose, Bld: 102 mg/dL — ABNORMAL HIGH (ref 70–99)
POTASSIUM: 3.5 mmol/L (ref 3.5–5.1)
Phosphorus: 3.1 mg/dL (ref 2.5–4.6)
Sodium: 143 mmol/L (ref 135–145)

## 2017-10-17 LAB — HEPARIN LEVEL (UNFRACTIONATED): Heparin Unfractionated: 0.31 IU/mL (ref 0.30–0.70)

## 2017-10-17 LAB — APTT: APTT: 96 s — AB (ref 24–36)

## 2017-10-17 LAB — TROPONIN I
Troponin I: 0.03 ng/mL (ref <0.03)
Troponin I: 0.03 ng/mL (ref <0.03)

## 2017-10-17 LAB — TSH: TSH: 11.655 u[IU]/mL — ABNORMAL HIGH (ref 0.350–4.500)

## 2017-10-17 LAB — ECHOCARDIOGRAM COMPLETE
HEIGHTINCHES: 68 in
WEIGHTICAEL: 2455.04 [oz_av]

## 2017-10-17 LAB — MRSA PCR SCREENING: MRSA BY PCR: NEGATIVE

## 2017-10-17 LAB — MAGNESIUM: Magnesium: 2.1 mg/dL (ref 1.7–2.4)

## 2017-10-17 MED ORDER — BLISTEX MEDICATED EX OINT
TOPICAL_OINTMENT | CUTANEOUS | Status: DC | PRN
  Administered 2017-10-17 (×2): via TOPICAL
  Filled 2017-10-17 (×2): qty 6.3

## 2017-10-17 MED ORDER — MIDODRINE HCL 5 MG PO TABS
5.0000 mg | ORAL_TABLET | Freq: Three times a day (TID) | ORAL | Status: DC
  Administered 2017-10-17 – 2017-10-18 (×3): 5 mg via ORAL
  Filled 2017-10-17 (×3): qty 1

## 2017-10-17 MED ORDER — AMBRISENTAN 5 MG PO TABS
10.0000 mg | ORAL_TABLET | Freq: Every day | ORAL | Status: DC
  Administered 2017-10-18 – 2017-10-19 (×2): 10 mg via ORAL
  Filled 2017-10-17 (×3): qty 2

## 2017-10-17 MED ORDER — TREPROSTINIL 0.6 MG/ML IN SOLN
72.0000 ug | Freq: Four times a day (QID) | RESPIRATORY_TRACT | Status: DC
  Administered 2017-10-18 – 2017-10-20 (×9): 72 ug via RESPIRATORY_TRACT

## 2017-10-17 MED ORDER — POTASSIUM CHLORIDE CRYS ER 20 MEQ PO TBCR
40.0000 meq | EXTENDED_RELEASE_TABLET | Freq: Once | ORAL | Status: AC
  Administered 2017-10-17: 40 meq via ORAL
  Filled 2017-10-17: qty 2

## 2017-10-17 MED ORDER — ORAL CARE MOUTH RINSE
15.0000 mL | Freq: Two times a day (BID) | OROMUCOSAL | Status: DC
  Administered 2017-10-17 (×2): 15 mL via OROMUCOSAL

## 2017-10-17 MED ORDER — CHLORHEXIDINE GLUCONATE 0.12 % MT SOLN
15.0000 mL | Freq: Two times a day (BID) | OROMUCOSAL | Status: DC
  Administered 2017-10-17 – 2017-10-18 (×3): 15 mL via OROMUCOSAL
  Filled 2017-10-17 (×2): qty 15

## 2017-10-17 MED ORDER — FUROSEMIDE 40 MG PO TABS
40.0000 mg | ORAL_TABLET | Freq: Every day | ORAL | Status: DC
  Administered 2017-10-17 – 2017-10-20 (×4): 40 mg via ORAL
  Filled 2017-10-17 (×4): qty 1

## 2017-10-17 NOTE — Progress Notes (Signed)
  Echocardiogram 2D Echocardiogram has been performed.  Jimmy King 10/17/2017, 12:36 PM

## 2017-10-17 NOTE — Progress Notes (Addendum)
Jimmy King  DKS:[id number] DOB: November 20, 1965 DOA: 10/16/2017 PCP: System, Pcp Not In    LOS: 1 day   Reason for Consult / Chief Complaint:  Hypoxic respiratory failure and hypervolemia   Consulting MD and date of consult:  Dr. Maryan Rued 10/16/2017  HPI/summary of hospital stay:  HPI obtained from medical chart review, patient's brother, and medical records brought by patient's brother from [place], 27.    52 year old male visiting brother from [place] with [place] of Down's syndrome, pulmonary hypertension on home O2 4L, CAD, VSD (never repaired), OSA on home BiPAP, hypothyroidism, gout, and TIA (on Xarelto) who presented to [facility] ED with complaints of swollen legs, distended abdomen, wet cough, and shortness of breath.  His brother picked him up in [place] on Tuesday and noted he had some swelling and wet cough at that time but since has progressed.  His brother also states patient chronically has low blood pressure with SBP in the 80-90's.  Denies fever.  States patient never complains.  No reports of bleeding except for busted lip in which his brother states wont heal because patient keeps picking at.  Patient's is primarily managed by Dr. Leane Para in [place], [place] [phone]).   In the ER, he was noted to be tachypneic, afebrile, above average SBP for patient, sats 79% on 4L, and physical exam with evidence of fluid overload.  Labs noted for WBC 3.7, Hgb 14.7, Plts 103, sCr 1.40 (baseline 1.5), BNP 527, troponin 0.03, CXR showing pulmonary edema and cardiomegaly.   VBG noted at 7.341/ 52.7/ 27/ 28.5.  He was given lasix and placed on BiPAP with improvement in work of breathing and saturation.  He is to be transferred to [facility] for further evaluation and management, PCCM to admit.  9/3>> Diuresed 2.9 L, can tolerate breaks from BiPap on 6 L [place]>> Lasix to po dosing  Subjective  No complaints per patient Currently on 6 L [place] for brief break from BiPAP Very pleasant States he can breath  better  Assessment & Plan:  Severe Pulmonary Hypertension (mixed  WHO group 1 and 2 per records ) with acute volume overload  Hx VSD (not repaired) - on xarelto, letarisis 18m qd, Tyvaso, riociguat 2.551mBID, and selexipag 160028mBID - s/p lasix 83m31m ER with good UOP, 1 occurrence of incontinence - LFTs wnl  - brother thinks patient was taken off lasix 20mg80mly around 3 months ago secondary to hypotension Negative 2.9 L overnight 9/3 Some hypotension with diuresis P:  Appreciate cards input Trend troponin and EKG TTE pending 9/3  Hold Xarelto (last dose 9/1), Continue heparin per pharmacy Continue Gentle diuresis daily with po lasix since patient will be RV dependent; per Cards Monitor for hypotension closely with continued gentle diuresis  Will add Midodrine 5 mg TID for hypotension Strict I/O's/ daily wts   Trend  mag and phos, trend renal function Resume letarisis, Tyvaso, riociguat, and selexipag Consider resuming home eplerenone  Acute on chronic hypoxic respiratory failure related to pulmonary hypertension and hypervolemia OSA on home HS bipap - baseline home O2 of 4L  P:  Continue BiPAP as needed, and mandatory qHS Goal sats 90-92% Continue gentle diuresis Trend CXR to guide diuresis  Reported Hypotension - reports that patient runs on the lower side, SBP 80-90 - has been normotensive here - MAP goal is >65 mm Hg P:  Monitor in ICU>> at risk with diuresis Resume home bystolic 1.25 1.48aily with holding parameters and  eplerenone  Creatinine 1.46 with Diuresis up from 1.4 Plan: Trend BMET Avoid nephrotoxic medications  Maintain renal perfusion Replete electrolytes as needed  Trend Mag and Phos  Leukopenia  Thrombocytopenia  P:  Trend CBC SCDs Holding Xarelto, heparin gtt per pharmacy Monitor for obvious bleeding    Hypothyroidism  TSH 11.655 on 9/3 P:  Synthroid daily Will check T4, may need to increase synthroid dose  Depression P:   Resume home zoloft  Best Practice / Goals of Care / Disposition.   DVT prophylaxis: SCDs, heparin  GI prophylaxis: Protonix  Diet: NPO, except meds Mobility: bedrest Code Status: DNR Family Communication: Patient's brother, [name] [phone]) updated at bedside  Disposition / Summary of Today's Plan 10/17/17   Admit to ICU for stabilization  Consider transfer to SDU  Consultants: date of consult/date signed off (if applicable)/final recs  Cardiology/ HF  Procedures:  Significant Diagnostic Tests: 9/2 CXR>> The constellation of findings is most consistent with cardiomegaly, small effusions, and pulmonary edema.  Micro Data: 9/2 MRSA PCR >>  Antimicrobials:  none  Objective    Examination: General:  Adult male with Down syndrome features sitting in bed in NAD on [place] at 6 L HEENT: MM pink/dry, dried blood to lips, pupils 4/reactive, anicteric, +JVD Neuro: Alert, f/c, MAE, very pleasant CV: S1, S2, rrr, +murmur, no RG PULM: even/non-labored, lungs bilaterally coarse, faint exp wheeze, scattered rales throughout VZ:CHYI, distended, non-tender, bs active  Extremities: warm/dry, +2 BLE edema - no asymmetry noted , no obvious deformities Skin: Intact, warm and dry, no rashes lesions noted  Blood pressure 93/63, pulse 67, temperature 98 F (36.7 C), temperature source Axillary, resp. rate (!) 25, height _0  (1.727 m), weight 69.6 kg, SpO2 91 %.    FiO2 (%):  [50 %] 50 %   Intake/Output Summary (Last 24 hours) at 10/17/2017 0913 Last data filed at 10/17/2017 0900 Gross per 24 hour  Intake 111.9 ml  Output 2995 ml  Net -2883.1 ml   Filed Weights   10/16/17 1724 10/16/17 2155  Weight: 70.7 kg 69.6 kg     Labs    CBC: Recent Labs  Lab 10/16/17 1748 10/17/17 0420  WBC 3.7* 3.1*  NEUTROABS 2.6  --   HGB 14.7 13.7  HCT 44.7 42.7  MCV 92.7 93.6  PLT 103* 502*   Basic Metabolic Panel: Recent Labs  Lab 10/16/17 1748 10/17/17 0420  NA 142 143  K 3.5 3.5   CL 106 106  CO2 29 31  GLUCOSE 95 102*  BUN 20 18  CREATININE 1.40* 1.46*  CALCIUM 7.9* 7.6*  MG  --  2.1  PHOS  --  3.1   GFR: Estimated Creatinine Clearance: 58.6 mL/min (A) (by C-G formula based on SCr of 1.46 mg/dL (H)). Recent Labs  Lab 10/16/17 1748 10/17/17 0420  WBC 3.7* 3.1*   Liver Function Tests: Recent Labs  Lab 10/16/17 1748 10/17/17 0420  AST 21  --   ALT 11  --   ALKPHOS 36*  --   BILITOT 0.9  --   PROT 6.6  --   ALBUMIN 3.6 2.8*   No results for input(s): LIPASE, AMYLASE in the last 168 hours. No results for input(s): AMMONIA in the last 168 hours. ABG    Component Value Date/Time   HCO3 28.5 (H) 10/16/2017 1754   TCO2 30 10/16/2017 1754   O2SAT 46.0 10/16/2017 1754    Coagulation Profile: No results for input(s): INR, PROTIME in the last 168 hours.  Cardiac Enzymes: Recent Labs  Lab 10/16/17 1748 10/16/17 2256 10/17/17 0420  TROPONINI 0.03* <0.03 0.03*   HbA1C: No results found for: HGBA1C CBG: No results for input(s): GLUCAP in the last 168 hours.   Allergies No Known Allergies  Home meds  Prior to Admission medications   Medication Sig Start Date End Date Taking? Authorizing Provider  allopurinol (ZYLOPRIM) 300 MG tablet Take 300 mg by mouth daily.   Yes [provider]  ambrisentan (LETAIRIS) 10 MG tablet Take by mouth daily.   Yes [provider]  eplerenone (INSPRA) 25 MG tablet Take 12.5 mg by mouth daily.   Yes [provider]  levothyroxine (SYNTHROID, LEVOTHROID) 88 MCG tablet Take 88 mcg by mouth daily before breakfast.   Yes [provider]  nebivolol (BYSTOLIC) 2.5 MG tablet Take 2.5 mg by mouth daily.   Yes [provider]  pantoprazole (PROTONIX) 40 MG tablet Take 40 mg by mouth daily.   Yes [provider]  Riociguat (ADEMPAS) 2.5 MG TABS Take by mouth 3 (three) times daily.   Yes [provider]  Rivaroxaban (XARELTO) 15 MG TABS tablet Take 15 mg by mouth  every evening.   Yes [provider]  Selexipag (UPTRAVI) 1600 MCG TABS Take by mouth 2 (two) times daily.   Yes [provider]  sertraline (ZOLOFT) 50 MG tablet Take 50 mg by mouth daily.   Yes [provider]    Magdalen Spatz, , AGACNP-BC [facility] Pgr: [phone] 10/17/2017, 9:13 AM

## 2017-10-17 NOTE — Progress Notes (Signed)
Progress Note  Patient Name: Jimmy King Date of Encounter: 10/17/2017  Primary Cardiologist:   No primary care provider on file.   Subjective   He is comfortable this morning.  Leg swelling is reduced.   Inpatient Medications    Scheduled Meds: . allopurinol  300 mg Oral Daily  . ambrisentan  10 mg Oral Daily  . eplerenone  12.5 mg Oral Daily  . levothyroxine  88 mcg Oral QAC breakfast  . nebivolol  1.25 mg Oral Daily  . pantoprazole  40 mg Oral Daily  . Riociguat  2.5 mg Oral TID  . Selexipag  1,600 mcg Oral BID  . sertraline  50 mg Oral Daily  . Treprostinil  24 mcg Inhalation QID   Continuous Infusions: . heparin 1,200 Units/hr (10/16/17 2339)   PRN Meds:    Vital Signs    Vitals:   10/17/17 0424 10/17/17 0500 10/17/17 0600 10/17/17 0700  BP: (!) 81/49 (!) 91/53 (!) 88/54 95/66  Pulse: 61 (!) 56 62 65  Resp: _0 Temp:      TempSrc:      SpO2: 96% 92% 96% 95%  Weight:      Height:        Intake/Output Summary (Last 24 hours) at 10/17/2017 0740 Last data filed at 10/17/2017 0700 Gross per 24 hour  Intake 87.9 ml  Output 2995 ml  Net -2907.1 ml   Filed Weights   10/16/17 1724 10/16/17 2155  Weight: 70.7 kg 69.6 kg    Telemetry    NSR - Personally Reviewed  ECG    NA - Personally Reviewed  Physical Exam   GEN: No acute distress.   Neck:   Positive  JVD Cardiac: RRR, harsh holosystolic murmurs, rubs, or gallops.  Respiratory: Clear  to auscultation bilaterally. GI: Soft, nontender, mildly distended  MS:   Trace edema; No deformity. Neuro:  Nonfocal  Psych: Normal affect   Labs    Chemistry Recent Labs  Lab 10/16/17 1748 10/17/17 0420  NA 142 143  K 3.5 3.5  CL 106 106  CO2 29 31  GLUCOSE 95 102*  BUN 20 18  CREATININE 1.40* 1.46*  CALCIUM 7.9* 7.6*  PROT 6.6  --   ALBUMIN 3.6 2.8*  AST 21  --   ALT 11  --   ALKPHOS 36*  --   BILITOT 0.9  --   GFRNONAA 57* 54*  GFRAA >60 >60  ANIONGAP 7 6      Hematology Recent Labs  Lab 10/16/17 1748 10/17/17 0420  WBC 3.7* 3.1*  RBC 4.82 4.56  HGB 14.7 13.7  HCT 44.7 42.7  MCV 92.7 93.6  MCH 30.5 30.0  MCHC 32.9 32.1  RDW 16.9* 16.4*  PLT 103* 109*    Cardiac Enzymes Recent Labs  Lab 10/16/17 1748 10/16/17 2256 10/17/17 0420  TROPONINI 0.03* <0.03 0.03*   No results for input(s): TROPIPOC in the last 168 hours.   BNP Recent Labs  Lab 10/16/17 1748  BNP 527.1*     DDimer No results for input(s): DDIMER in the last 168 hours.   Radiology    Dg Chest Portable 1 View  Result Date: 10/16/2017 CLINICAL DATA:  Shortness of breath and leg swelling. EXAM: PORTABLE CHEST 1 VIEW COMPARISON:  None. FINDINGS: Cardiomegaly. Increased interstitial opacities bilaterally. Atelectasis in the right base. Probable small effusions. No other acute abnormalities. IMPRESSION: The constellation of findings is most consistent with cardiomegaly, small effusions, and pulmonary edema.  Electronically Signed   By: Dorise Bullion III M.D   On: 10/16/2017 18:01    Cardiac Studies   ECHO:  Pending.   Patient Profile     52 y.o. 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    Assessment & Plan    PULM HTN :  He is on inhaled prostacyclin, oral ET antagonist and riociguat which have apparently been his stable drugs for years.    Continue current therapy.  He has his home meds.    ACUTE ON CHRONIC RESPIRATORY FAILURE:  Likely tenuous baseline with now acute on chronic diastolic HF.  He has been off of diuretics at home.  TTE is pending.   He is down 2.9 liters.  Change to  today (4l baseline).    BPs have been very borderline.   I would suggest PO Lasix today and then possibly resume eplerenone.      For questions or updates, please contact [place] Please consult [email or web address] for contact  info under Cardiology/STEMI.   Signed, Minus Breeding, MD  10/17/2017, 7:40 AM

## 2017-10-17 NOTE — Progress Notes (Signed)
RT placed patient on BIPAP for the night. Patient is tolerating well at this time. RT will monitor as needed.

## 2017-10-17 NOTE — Progress Notes (Signed)
[facility] for Heparin (Xarelto on hold) Indication: Hx of TIA with unrepaired VSD  No Known Allergies  Patient Measurements: Height: 5' 8" (172.7 cm) Weight: 153 lb 7 oz (69.6 kg) IBW/kg (Calculated) : 68.4  Vital Signs: Temp: 98 F (36.7 C) (09/03 0400) Temp Source: Axillary (09/03 0400) BP: 78/49 (09/03 1007) Pulse Rate: 56 (09/03 1007)  Labs: Recent Labs    10/16/17 1748 10/16/17 2256 10/17/17 0420 10/17/17 0816  HGB 14.7  --  13.7  --   HCT 44.7  --  42.7  --   PLT 103*  --  109*  --   APTT  --   --   --  96*  HEPARINUNFRC  --   --   --  0.31  CREATININE 1.40*  --  1.46*  --   TROPONINI 0.03* <0.03 0.03* 0.03*    Estimated Creatinine Clearance: 58.6 mL/min (A) (by C-G formula based on SCr of 1.46 mg/dL (H)).  Medical History: Past Medical History:  Diagnosis Date  . Coronary artery stenosis   . Down syndrome   . Pulmonary hypertension ([place])   . TIA (transient ischemic attack)   . Ventricular septal defect    Assessment: 52 y/o M with hx down syndrome, pulm HTN. He is visiting [place] from [place] and presents with heart failure/resp failure. He is on Xarelto PTA for hx of TIA along with unrepaired VSD. Last dose of Xarelto was 9/1 ~2000.  9/3: Heparin levels at lower end of therapeutic at 0.31, aPTT therapeutic at 96. No s/sx of bleeding noted. Hgb WNL, Plt low/stable. Will not check aPTT's any further d/t heparin level correlation.  Goal of Therapy:  Heparin level 0.3-0.7 units/ml aPTT 66-102 seconds Monitor platelets by anticoagulation protocol: Yes   Plan:  Will slightly increase heparin infusion to 1250 units/hr Daily CBC/heparin level, monitor for bleeding  Thank you for involving pharmacy in this patient's care.  Janae Bridgeman, PharmD PGY1 Pharmacy Resident Phone: [phone] 10/17/2017 10:38 AM

## 2017-10-18 ENCOUNTER — Inpatient Hospital Stay (HOSPITAL_COMMUNITY): Payer: Medicare Other

## 2017-10-18 DIAGNOSIS — I509 Heart failure, unspecified: Secondary | ICD-10-CM

## 2017-10-18 DIAGNOSIS — J9620 Acute and chronic respiratory failure, unspecified whether with hypoxia or hypercapnia: Secondary | ICD-10-CM

## 2017-10-18 LAB — BASIC METABOLIC PANEL
Anion gap: 8 (ref 5–15)
BUN: 11 mg/dL (ref 6–20)
CHLORIDE: 103 mmol/L (ref 98–111)
CO2: 30 mmol/L (ref 22–32)
CREATININE: 1.27 mg/dL — AB (ref 0.61–1.24)
Calcium: 7.8 mg/dL — ABNORMAL LOW (ref 8.9–10.3)
GFR calc Af Amer: >60 mL/min (ref >60)
GFR calc non Af Amer: >60 mL/min (ref >60)
GLUCOSE: 81 mg/dL (ref 70–99)
POTASSIUM: 3.8 mmol/L (ref 3.5–5.1)
Sodium: 141 mmol/L (ref 135–145)

## 2017-10-18 LAB — CBC
HEMATOCRIT: 43 % (ref 39.0–52.0)
Hemoglobin: 14 g/dL (ref 13.0–17.0)
MCH: 30.4 pg (ref 26.0–34.0)
MCHC: 32.6 g/dL (ref 30.0–36.0)
MCV: 93.5 fL (ref 78.0–100.0)
PLATELETS: 137 10*3/uL — AB (ref 150–400)
RBC: 4.6 MIL/uL (ref 4.22–5.81)
RDW: 16.2 % — AB (ref 11.5–15.5)
WBC: 2.5 10*3/uL — ABNORMAL LOW (ref 4.0–10.5)

## 2017-10-18 LAB — T4, FREE: FREE T4: 1.17 ng/dL (ref 0.82–1.77)

## 2017-10-18 LAB — HEPARIN LEVEL (UNFRACTIONATED): Heparin Unfractionated: 0.48 IU/mL (ref 0.30–0.70)

## 2017-10-18 MED ORDER — ZINC OXIDE 40 % EX OINT
TOPICAL_OINTMENT | CUTANEOUS | Status: DC | PRN
  Filled 2017-10-18: qty 57

## 2017-10-18 MED ORDER — ORAL CARE MOUTH RINSE
15.0000 mL | Freq: Two times a day (BID) | OROMUCOSAL | Status: DC
  Administered 2017-10-18 – 2017-10-19 (×2): 15 mL via OROMUCOSAL

## 2017-10-18 MED ORDER — RIVAROXABAN 20 MG PO TABS
20.0000 mg | ORAL_TABLET | Freq: Every day | ORAL | Status: DC
  Administered 2017-10-18 – 2017-10-19 (×2): 20 mg via ORAL
  Filled 2017-10-18 (×2): qty 1

## 2017-10-18 MED ORDER — MIDODRINE HCL 5 MG PO TABS
10.0000 mg | ORAL_TABLET | Freq: Three times a day (TID) | ORAL | Status: DC
  Administered 2017-10-18 – 2017-10-20 (×6): 10 mg via ORAL
  Filled 2017-10-18 (×6): qty 2

## 2017-10-18 NOTE — Progress Notes (Signed)
[facility] for Heparin (Xarelto on hold) Indication: Hx of TIA with unrepaired VSD  No Known Allergies  Patient Measurements: Height: _0  (172.7 cm) Weight: 145 lb 4.5 oz (65.9 kg) IBW/kg (Calculated) : 68.4  Vital Signs: Temp: 98.8 F (37.1 C) (09/04 0400) Temp Source: Oral (09/04 0400) BP: 83/55 (09/04 0700) Pulse Rate: 36 (09/04 0700)  Labs: Recent Labs    10/16/17 1748 10/16/17 2256 10/17/17 0420 10/17/17 0816 10/18/17 0351  HGB 14.7  --  13.7  --  14.0  HCT 44.7  --  42.7  --  43.0  PLT 103*  --  109*  --  137*  APTT  --   --   --  96*  --   HEPARINUNFRC  --   --   --  0.31 0.48  CREATININE 1.40*  --  1.46*  --  1.27*  TROPONINI 0.03* <0.03 0.03* 0.03*  --     Estimated Creatinine Clearance: 64.9 mL/min (A) (by C-G formula based on SCr of 1.27 mg/dL (H)).  Medical History: Past Medical History:  Diagnosis Date  . Coronary artery stenosis   . Down syndrome   . Pulmonary hypertension ([place])   . TIA (transient ischemic attack)   . Ventricular septal defect    Assessment: 52 y/o M with hx down's syndrome, pulm HTN. He is visiting [place] from [place] and presents with heart failure/resp failure. He is on Xarelto PTA for hx of TIA along with unrepaired VSD. Last dose of Xarelto was 9/1 ~2000.   9/3: Heparin level therapeutic this morning at 0.48. No s/sx of bleeding noted. CBC WNL.  Goal of Therapy:  Heparin level 0.3-0.7 units/ml aPTT 66-102 seconds Monitor platelets by anticoagulation protocol: Yes   Plan:  Continue heparin infusion at 1250 units/hr Daily CBC/heparin level, monitor for bleeding  Thank you for involving pharmacy in this patient's care.  Janae Bridgeman, PharmD PGY1 Pharmacy Resident Phone: [phone] 10/18/2017 9:57 AM

## 2017-10-18 NOTE — Progress Notes (Signed)
Jimmy King  HMC:[id number] DOB: 06/03/1965 DOA: 10/16/2017 PCP: System, Pcp Not In    LOS: 2 days   Reason for Consult / Chief Complaint:  Hypoxic respiratory failure and hypervolemia   Consulting MD and date of consult:  Dr. Maryan Rued 10/16/2017  HPI/summary of hospital stay:  HPI obtained from medical chart review, patient's brother, and medical records brought by patient's brother from [place], 38.    52 year old male visiting brother from [place] with [place] of Down's syndrome, pulmonary hypertension on home O2 4L, CAD, VSD (never repaired), OSA on home BiPAP, hypothyroidism, gout, and TIA (on Xarelto) who presented to [facility] ED with complaints of swollen legs, distended abdomen, wet cough, and shortness of breath.  His brother picked him up in [place] on Tuesday and noted he had some swelling and wet cough at that time but since has progressed.  His brother also states patient chronically has low blood pressure with SBP in the 80-90's.  Denies fever.  States patient never complains.  No reports of bleeding except for busted lip in which his brother states wont heal because patient keeps picking at.  Patient's is primarily managed by Dr. Leane Para in [place], [place] [phone]).   In the ER, he was noted to be tachypneic, afebrile, above average SBP for patient, sats 79% on 4L, and physical exam with evidence of fluid overload.  Labs noted for WBC 3.7, Hgb 14.7, Plts 103, sCr 1.40 (baseline 1.5), BNP 527, troponin 0.03, CXR showing pulmonary edema and cardiomegaly.   VBG noted at 7.341/ 52.7/ 27/ 28.5.  He was given lasix and placed on BiPAP with improvement in work of breathing and saturation.  He is to be transferred to [facility] for further evaluation and management, PCCM to admit.  9/3>> Diuresed 2.9 L, can tolerate breaks from BiPap on 6 L [place]>> Lasix to po dosing 9/4 talked to the brother who is a dentist consider putting him Lasix brother mentioned the patient is not compliant I told him to go on  Lasix 40 mg p.o. daily for week then go to 20 was mentioning that cardiology mentioned another diuretic dose and regimen that I have to explore.  Subjective  No complaints per patient Currently on 6 L [place] for brief break from BiPAP Very pleasant States he can breath better  Assessment & Plan:  Severe Pulmonary Hypertension (mixed  WHO group 1 and 2 per records ) with acute volume overload  Hx VSD (not repaired) - on xarelto, letarisis 27m qd, Tyvaso, riociguat 2.534mBID, and selexipag 160030mBID - s/p lasix 12m24m ER with good UOP, 1 occurrence of incontinence - LFTs wnl  - brother thinks patient was taken off lasix 20mg74mly around 3 months ago secondary to hypotension Negative 2.9 L overnight 9/3 Some hypotension with diuresis P:  Appreciate cards input Trend troponin and EKG TTE done PA pressures in the 70s to 80s Hold Xarelto (last dose 9/1), Continue heparin per pharmacy Continue Gentle diuresis daily with po lasix since patient will be RV dependent; per Cards Monitor for hypotension closely with continued gentle diuresis  Will add Midodrine 5 mg TID for hypotension Strict I/O's/ daily wts   Trend  mag and phos, trend renal function Resume letarisis, Tyvaso, riociguat, and selexipag Consider resuming home eplerenone  Acute on chronic hypoxic respiratory failure related to pulmonary hypertension and hypervolemia OSA on home HS bipap - baseline home O2 of 4L  P:  Continue BiPAP as needed, and mandatory  qHS Goal sats 90-92% Continue gentle diuresis Trend CXR to guide diuresis  Reported Hypotension - reports that patient runs on the lower side, SBP 80-90 - has been normotensive here - MAP goal is >65 mm Hg P:  Transfer to stepdown unit Resume home bystolic 1.61 mg daily with holding parameters and eplerenone Start midodrine 10 mg to support the blood pressure  Creatinine 1.46 with Diuresis up from 1.4 Plan: Trend BMET Avoid nephrotoxic medications  Maintain  renal perfusion Replete electrolytes as needed  Trend Mag and Phos  Leukopenia  Thrombocytopenia  P:  Trend CBC SCDs Holding Xarelto, heparin gtt per pharmacy Monitor for obvious bleeding    Hypothyroidism  TSH 11.655 on 9/3 P:  Synthroid daily Will check T4, may need to increase synthroid dose  Depression P:  Resume home zoloft  Best Practice / Goals of Care / Disposition.   DVT prophylaxis: SCDs, heparin  GI prophylaxis: Protonix  Diet: NPO, except meds Mobility: bedrest Code Status: DNR Family Communication: Patient's brother, [name] [phone]) updated at bedside  Disposition / Summary of Today's Plan 10/18/17   Transfer to stepdown unit  Consultants: date of consult/date signed off (if applicable)/final recs  Cardiology/ HF  Procedures:  Significant Diagnostic Tests: 9/2 CXR>> The constellation of findings is most consistent with cardiomegaly, small effusions, and pulmonary edema.  Micro Data: 9/2 MRSA PCR >>  Antimicrobials:  none  Objective    Examination: General:  Adult male with Down syndrome features sitting in bed in NAD on [place] at 6 L HEENT: MM pink/dry, dried blood to lips, pupils 4/reactive, anicteric, +JVD Neuro: Alert, f/c, MAE, very pleasant CV: S1, S2, rrr, +murmur, no RG PULM: even/non-labored, lungs bilaterally coarse, faint exp wheeze, scattered rales throughout JX:BJYN, distended, non-tender, bs active  Extremities: warm/dry, +2 BLE edema - no asymmetry noted , no obvious deformities Skin: Intact, warm and dry, no rashes lesions noted  Blood pressure (!) 83/59, pulse (!) 55, temperature 98.8 F (37.1 C), temperature source Oral, resp. rate 14, height _0  (1.727 m), weight 65.9 kg, SpO2 92 %.    FiO2 (%):  [50 %] 50 %   Intake/Output Summary (Last 24 hours) at 10/18/2017 1029 Last data filed at 10/18/2017 1000 Gross per 24 hour  Intake 740.57 ml  Output 3507 ml  Net -2766.43 ml   Filed Weights   10/16/17 1724 10/16/17 2155  10/18/17 0500  Weight: 70.7 kg 69.6 kg 65.9 kg     Labs    CBC: Recent Labs  Lab 10/16/17 1748 10/17/17 0420 10/18/17 0351  WBC 3.7* 3.1* 2.5*  NEUTROABS 2.6  --   --   HGB 14.7 13.7 14.0  HCT 44.7 42.7 43.0  MCV 92.7 93.6 93.5  PLT 103* 109* 829*   Basic Metabolic Panel: Recent Labs  Lab 10/16/17 1748 10/17/17 0420 10/18/17 0351  NA 142 143 141  K 3.5 3.5 3.8  CL 106 106 103  CO2 _1 GLUCOSE 95 102* 81  BUN _2 CREATININE 1.40* 1.46* 1.27*  CALCIUM 7.9* 7.6* 7.8*  MG  --  2.1  --   PHOS  --  3.1  --    GFR: Estimated Creatinine Clearance: 64.9 mL/min (A) (by C-G formula based on SCr of 1.27 mg/dL (H)). Recent Labs  Lab 10/16/17 1748 10/17/17 0420 10/18/17 0351  WBC 3.7* 3.1* 2.5*   Liver Function Tests: Recent Labs  Lab 10/16/17 1748 10/17/17 0420  AST 21  --   ALT  11  --   ALKPHOS 36*  --   BILITOT 0.9  --   PROT 6.6  --   ALBUMIN 3.6 2.8*   No results for input(s): LIPASE, AMYLASE in the last 168 hours. No results for input(s): AMMONIA in the last 168 hours. ABG    Component Value Date/Time   HCO3 28.5 (H) 10/16/2017 1754   TCO2 30 10/16/2017 1754   O2SAT 46.0 10/16/2017 1754    Coagulation Profile: No results for input(s): INR, PROTIME in the last 168 hours. Cardiac Enzymes: Recent Labs  Lab 10/16/17 1748 10/16/17 2256 10/17/17 0420 10/17/17 0816  TROPONINI 0.03* <0.03 0.03* 0.03*   HbA1C: No results found for: HGBA1C CBG: No results for input(s): GLUCAP in the last 168 hours.   Allergies No Known Allergies  Home meds  Prior to Admission medications   Medication Sig Start Date End Date Taking? Authorizing Provider  allopurinol (ZYLOPRIM) 300 MG tablet Take 300 mg by mouth daily.   Yes [provider]  ambrisentan (LETAIRIS) 10 MG tablet Take by mouth daily.   Yes [provider]  eplerenone (INSPRA) 25 MG tablet Take 12.5 mg by mouth daily.   Yes [provider]  levothyroxine  (SYNTHROID, LEVOTHROID) 88 MCG tablet Take 88 mcg by mouth daily before breakfast.   Yes [provider]  nebivolol (BYSTOLIC) 2.5 MG tablet Take 2.5 mg by mouth daily.   Yes [provider]  pantoprazole (PROTONIX) 40 MG tablet Take 40 mg by mouth daily.   Yes [provider]  Riociguat (ADEMPAS) 2.5 MG TABS Take by mouth 3 (three) times daily.   Yes [provider]  Rivaroxaban (XARELTO) 15 MG TABS tablet Take 15 mg by mouth every evening.   Yes [provider]  Selexipag (UPTRAVI) 1600 MCG TABS Take by mouth 2 (two) times daily.   Yes [provider]  sertraline (ZOLOFT) 50 MG tablet Take 50 mg by mouth daily.   Yes [provider]    Rollene Fare MD  [facility] Pgr: [phone] 10/18/2017, 10:29 AM

## 2017-10-18 NOTE — Progress Notes (Signed)
Progress Note  Patient Name: Jimmy King Date of Encounter: 10/18/2017  Primary Cardiologist:   No primary care provider on file.   Subjective   Breathing OK. No complaints.   Inpatient Medications    Scheduled Meds: . allopurinol  300 mg Oral Daily  . ambrisentan  10 mg Oral Daily  . eplerenone  12.5 mg Oral Daily  . furosemide  40 mg Oral Daily  . levothyroxine  88 mcg Oral QAC breakfast  . mouth rinse  15 mL Mouth Rinse BID  . midodrine  10 mg Oral TID WC  . nebivolol  1.25 mg Oral Daily  . pantoprazole  40 mg Oral Daily  . Riociguat  2.5 mg Oral TID  . Selexipag  1,600 mcg Oral BID  . sertraline  50 mg Oral Daily  . Treprostinil  72 mcg Inhalation QID   Continuous Infusions: . heparin 1,250 Units/hr (10/18/17 1400)   PRN Meds:    Vital Signs    Vitals:   10/18/17 1000 10/18/17 1100 10/18/17 1300 10/18/17 1400  BP: (!) 83/59 97/67 (!) 89/60 113/69  Pulse: (!) 55 (!) 29 64 71  Resp: _0 Temp:  98.2 F (36.8 C)    TempSrc:  Oral    SpO2: 92% 92% 92% 92%  Weight:      Height:        Intake/Output Summary (Last 24 hours) at 10/18/2017 1428 Last data filed at 10/18/2017 1400 Gross per 24 hour  Intake 1499.01 ml  Output 2917 ml  Net -1417.99 ml   Filed Weights   10/16/17 1724 10/16/17 2155 10/18/17 0500  Weight: 70.7 kg 69.6 kg 65.9 kg    Telemetry    NSR - Personally Reviewed  ECG    NA - Personally Reviewed  Physical Exam   GEN: No  acute distress.   Neck:   Positive JVD Cardiac: RRR, systolic murmurs unchanged, no rubs, or gallops.  Respiratory: Clear   to auscultation bilaterally. GI: Soft, nontender, non-distended, normal bowel sounds  MS:  No edema; No deformity. Neuro:   Nonfocal  Psych: Oriented and appropriate     Labs    Chemistry Recent Labs  Lab 10/16/17 1748 10/17/17 0420 10/18/17 0351  NA 142 143 141  K 3.5 3.5 3.8  CL 106 106 103  CO2 _1 GLUCOSE 95 102* 81  BUN _2 CREATININE 1.40* 1.46*  1.27*  CALCIUM 7.9* 7.6* 7.8*  PROT 6.6  --   --   ALBUMIN 3.6 2.8*  --   AST 21  --   --   ALT 11  --   --   ALKPHOS 36*  --   --   BILITOT 0.9  --   --   GFRNONAA 57* 54* >60  GFRAA >60 >60 >60  ANIONGAP _3 Hematology Recent Labs  Lab 10/16/17 1748 10/17/17 0420 10/18/17 0351  WBC 3.7* 3.1* 2.5*  RBC 4.82 4.56 4.60  HGB 14.7 13.7 14.0  HCT 44.7 42.7 43.0  MCV 92.7 93.6 93.5  MCH 30.5 30.0 30.4  MCHC 32.9 32.1 32.6  RDW 16.9* 16.4* 16.2*  PLT 103* 109* 137*    Cardiac Enzymes Recent Labs  Lab 10/16/17 1748 10/16/17 2256 10/17/17 0420 10/17/17 0816  TROPONINI 0.03* <0.03 0.03* 0.03*   No results for input(s): TROPIPOC in the last 168 hours.   BNP Recent Labs  Lab 10/16/17 1748  BNP 527.1*  DDimer No results for input(s): DDIMER in the last 168 hours.   Radiology    Dg Chest Port 1 View  Result Date: 10/18/2017 CLINICAL DATA:  Shortness of breath EXAM: PORTABLE CHEST 1 VIEW COMPARISON:  October 16, 2017 FINDINGS: There is airspace consolidation with volume loss in the left lower lobe, increased from recent prior study. Elsewhere, there is mild interstitial edema. There is cardiomegaly with pulmonary venous hypertension. There is a small left pleural effusion. No adenopathy evident. No bone lesions. IMPRESSION: Increase in airspace consolidation left lower lobe, likely pneumonia. Underlying pulmonary vascular congestion with small left pleural effusion and interstitial pulmonary edema. Electronically Signed   By: Lowella Grip III M.D.   On: 10/18/2017 10:37   Dg Chest Portable 1 View  Result Date: 10/16/2017 CLINICAL DATA:  Shortness of breath and leg swelling. EXAM: PORTABLE CHEST 1 VIEW COMPARISON:  None. FINDINGS: Cardiomegaly. Increased interstitial opacities bilaterally. Atelectasis in the right base. Probable small effusions. No other acute abnormalities. IMPRESSION: The constellation of findings is most consistent with cardiomegaly, small  effusions, and pulmonary edema. Electronically Signed   By: Dorise Bullion III M.D   On: 10/16/2017 18:01    Cardiac Studies   ECHO:   - Left ventricle: The cavity size was normal. Systolic function was   normal. Wall motion was normal; there were no regional wall   motion abnormalities. Doppler parameters are consistent with   abnormal left ventricular relaxation (grade 1 diastolic   dysfunction). - Ventricular septum: There was a congenital ventricular septal   defectin the perimembranous region. - Aortic valve: There was no regurgitation. - Mitral valve: There was trivial regurgitation. - Left atrium: The atrium was moderately to severely dilated. - Right ventricle: The cavity size was moderately dilated. Wall   thickness was moderately increased. There was moderate   hypertrophy. Systolic function was moderately reduced. - Right atrium: The atrium was massively dilated. - Atrial septum: There was a medium-sized atrial septal defect. - Tricuspid valve: There was mild-moderate regurgitation. - Pulmonic valve: There was trivial regurgitation. - Pulmonary arteries: PA peak pressure: 79 mm Hg (S). - Pericardium, extracardiac: A trivial pericardial effusion was   identified posterior to the heart.  Patient Profile     52 y.o. 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    Assessment & Plan    PULM HTN :  He is on inhaled prostacyclin, oral ET antagonist and riociguat which have apparently been his stable drugs for years.    Echo as above.  Continue current therapy.  He will follow in HF/Pulmonary HTN clinic as an outpatient.      ACUTE ON CHRONIC RESPIRATORY FAILURE:  Responded well to diuresis.  On PO Lasix.  I think this was stopped in the past because of hypotension.  I will stop the eplerenone.  Continue Lasix as the diuretic of  choice.  Agree with midodrine.  I am not sure why he was on Bystolic and would prefer to stop this.  OK to stop heparin and restart Xarelto.      For questions or updates, please contact [place] Please consult [email or web address] for contact info under Cardiology/STEMI.   Signed, Minus Breeding, MD  10/18/2017, 2:28 PM

## 2017-10-18 NOTE — Progress Notes (Signed)
[facility] for Heparin > Rivaroxaban  Indication: Hx of TIA with unrepaired VSD  No Known Allergies  Patient Measurements: Height: 5' 8" (172.7 cm) Weight: 145 lb 4.5 oz (65.9 kg) IBW/kg (Calculated) : 68.4  Vital Signs: Temp: 98.8 F (37.1 C) (09/04 1500) Temp Source: Oral (09/04 1500) BP: 113/69 (09/04 1400) Pulse Rate: 71 (09/04 1400)  Labs: Recent Labs    10/16/17 1748 10/16/17 2256 10/17/17 0420 10/17/17 0816 10/18/17 0351  HGB 14.7  --  13.7  --  14.0  HCT 44.7  --  42.7  --  43.0  PLT 103*  --  109*  --  137*  APTT  --   --   --  96*  --   HEPARINUNFRC  --   --   --  0.31 0.48  CREATININE 1.40*  --  1.46*  --  1.27*  TROPONINI 0.03* <0.03 0.03* 0.03*  --     Estimated Creatinine Clearance: 64.9 mL/min (A) (by C-G formula based on SCr of 1.27 mg/dL (H)).  Medical History: Past Medical History:  Diagnosis Date  . Coronary artery stenosis   . Down syndrome   . Pulmonary hypertension ([place])   . TIA (transient ischemic attack)   . Ventricular septal defect    Assessment: 52 y/o M with hx down's syndrome, pulm HTN. He is visiting  from [place] and presents with heart failure/resp failure, volume overload. He is on Xarelto PTA for hx of TIA along with unrepaired VSD. Last dose of Xarelto was 9/1> has been on heparin while unstable now improved and will restart oral AC.  Previous rivaroxaban dose was 58m daily -  Limited old records available  But renal function much improved from admission CrCl > 639mmin.  Will restart rivaroxaban at dose appropriate for current renal function > 2022maily.  CBC WNL.  Goal of Therapy:  Heparin level 0.3-0.7 units/ml aPTT 66-102 seconds Monitor platelets by anticoagulation protocol: Yes   Plan:  Stop Heparin drip  Rivaroxaban 22m37mily - start today Monitor s/s bleeding    LisaBonnita Nasutirm.D. CPP, BCPS Clinical Pharmacist [phone] 4:14 PM

## 2017-10-19 ENCOUNTER — Inpatient Hospital Stay (HOSPITAL_COMMUNITY): Payer: Medicare Other

## 2017-10-19 LAB — CBC WITH DIFFERENTIAL/PLATELET
ABS IMMATURE GRANULOCYTES: 0 10*3/uL (ref 0.0–0.1)
BASOS ABS: 0 10*3/uL (ref 0.0–0.1)
BASOS PCT: 1 %
Eosinophils Absolute: 0 10*3/uL (ref 0.0–0.7)
Eosinophils Relative: 2 %
HCT: 45.2 % (ref 39.0–52.0)
HEMOGLOBIN: 14.4 g/dL (ref 13.0–17.0)
Immature Granulocytes: 1 %
LYMPHS PCT: 28 %
Lymphs Abs: 0.7 10*3/uL (ref 0.7–4.0)
MCH: 29.8 pg (ref 26.0–34.0)
MCHC: 31.9 g/dL (ref 30.0–36.0)
MCV: 93.6 fL (ref 78.0–100.0)
MONO ABS: 0.4 10*3/uL (ref 0.1–1.0)
Monocytes Relative: 17 %
NEUTROS PCT: 51 %
Neutro Abs: 1.2 10*3/uL — ABNORMAL LOW (ref 1.7–7.7)
Platelets: 140 10*3/uL — ABNORMAL LOW (ref 150–400)
RBC: 4.83 MIL/uL (ref 4.22–5.81)
RDW: 16.3 % — ABNORMAL HIGH (ref 11.5–15.5)
WBC: 2.4 10*3/uL — ABNORMAL LOW (ref 4.0–10.5)

## 2017-10-19 LAB — MAGNESIUM: Magnesium: 2.3 mg/dL (ref 1.7–2.4)

## 2017-10-19 LAB — BLOOD GAS, ARTERIAL
Acid-Base Excess: 7.4 mmol/L — ABNORMAL HIGH (ref 0.0–2.0)
Bicarbonate: 31.6 mmol/L — ABNORMAL HIGH (ref 20.0–28.0)
Drawn by: 51147
O2 CONTENT: 5 L/min
O2 SAT: 95.3 %
PATIENT TEMPERATURE: 98.8
PCO2 ART: 46.1 mmHg (ref 32.0–48.0)
pH, Arterial: 7.451 — ABNORMAL HIGH (ref 7.350–7.450)
pO2, Arterial: 74.6 mmHg — ABNORMAL LOW (ref 83.0–108.0)

## 2017-10-19 LAB — COMPREHENSIVE METABOLIC PANEL
ALBUMIN: 2.8 g/dL — AB (ref 3.5–5.0)
ALT: 9 U/L (ref 0–44)
AST: 16 U/L (ref 15–41)
Alkaline Phosphatase: 29 U/L — ABNORMAL LOW (ref 38–126)
Anion gap: 7 (ref 5–15)
BILIRUBIN TOTAL: 0.9 mg/dL (ref 0.3–1.2)
BUN: 11 mg/dL (ref 6–20)
CALCIUM: 7.8 mg/dL — AB (ref 8.9–10.3)
CO2: 32 mmol/L (ref 22–32)
Chloride: 102 mmol/L (ref 98–111)
Creatinine, Ser: 1.47 mg/dL — ABNORMAL HIGH (ref 0.61–1.24)
GFR calc non Af Amer: 54 mL/min — ABNORMAL LOW (ref >60)
GLUCOSE: 103 mg/dL — AB (ref 70–99)
POTASSIUM: 3.6 mmol/L (ref 3.5–5.1)
SODIUM: 141 mmol/L (ref 135–145)
TOTAL PROTEIN: 5.3 g/dL — AB (ref 6.5–8.1)

## 2017-10-19 LAB — APTT: aPTT: 44 seconds — ABNORMAL HIGH (ref 24–36)

## 2017-10-19 LAB — PROTIME-INR
INR: 2.51
PROTHROMBIN TIME: 26.9 s — AB (ref 11.4–15.2)

## 2017-10-19 LAB — HEPARIN LEVEL (UNFRACTIONATED): Heparin Unfractionated: >2.2 IU/mL — ABNORMAL HIGH (ref 0.30–0.70)

## 2017-10-19 NOTE — Progress Notes (Signed)
Progress Note  Patient Name: Jimmy King Date of Encounter: 10/19/2017  Primary Cardiologist:   No primary care provider on file.   Subjective   Breathing OK. No complaints.   Inpatient Medications    Scheduled Meds: . allopurinol  300 mg Oral Daily  . ambrisentan  10 mg Oral Daily  . furosemide  40 mg Oral Daily  . levothyroxine  88 mcg Oral QAC breakfast  . mouth rinse  15 mL Mouth Rinse BID  . midodrine  10 mg Oral TID WC  . pantoprazole  40 mg Oral Daily  . Riociguat  2.5 mg Oral TID  . rivaroxaban  20 mg Oral Q supper  . Selexipag  1,600 mcg Oral BID  . sertraline  50 mg Oral Daily  . Treprostinil  72 mcg Inhalation QID   Continuous Infusions:  PRN Meds:    Vital Signs    Vitals:   10/19/17 1000 10/19/17 1137 10/19/17 1200 10/19/17 1400  BP: (!) 79/50 95/63 100/60 100/60  Pulse:   (!) 57 (!) 53  Resp: _0 Temp:  98 F (36.7 C)    TempSrc:  Oral    SpO2:   95% 96%  Weight:      Height:        Intake/Output Summary (Last 24 hours) at 10/19/2017 1511 Last data filed at 10/19/2017 0800 Gross per 24 hour  Intake 600 ml  Output 1425 ml  Net -825 ml   Filed Weights   10/16/17 1724 10/16/17 2155 10/18/17 0500  Weight: 70.7 kg 69.6 kg 65.9 kg    Telemetry    NSR with PVCs - Personally Reviewed  ECG    NA - Personally Reviewed  Physical Exam   GEN: No  acute distress.   Neck:   Positive JVD Cardiac: RRR, systolic murmurs unchanged, no rubs, or gallops.  Respiratory: Clear   to auscultation bilaterally. GI: Soft, nontender, non-distended, normal bowel sounds  MS:  No edema; No deformity. Neuro:   Nonfocal  Psych: Oriented     Labs    Chemistry Recent Labs  Lab 10/16/17 1748 10/17/17 0420 10/18/17 0351 10/19/17 0250  NA 142 143 141 141  K 3.5 3.5 3.8 3.6  CL 106 106 103 102  CO2 _1 32  GLUCOSE 95 102* 81 103*  BUN _2 CREATININE 1.40* 1.46* 1.27* 1.47*  CALCIUM 7.9* 7.6* 7.8* 7.8*  PROT 6.6  --   --  5.3*   ALBUMIN 3.6 2.8*  --  2.8*  AST 21  --   --  16  ALT 11  --   --  9  ALKPHOS 36*  --   --  29*  BILITOT 0.9  --   --  0.9  GFRNONAA 57* 54* >60 54*  GFRAA >60 >60 >60 >60  ANIONGAP _3 Hematology Recent Labs  Lab 10/17/17 0420 10/18/17 0351 10/19/17 0250  WBC 3.1* 2.5* 2.4*  RBC 4.56 4.60 4.83  HGB 13.7 14.0 14.4  HCT 42.7 43.0 45.2  MCV 93.6 93.5 93.6  MCH 30.0 30.4 29.8  MCHC 32.1 32.6 31.9  RDW 16.4* 16.2* 16.3*  PLT 109* 137* 140*    Cardiac Enzymes Recent Labs  Lab 10/16/17 1748 10/16/17 2256 10/17/17 0420 10/17/17 0816  TROPONINI 0.03* <0.03 0.03* 0.03*   No results for input(s): TROPIPOC in the last 168 hours.   BNP Recent Labs  Lab 10/16/17  1748  BNP 527.1*     DDimer No results for input(s): DDIMER in the last 168 hours.   Radiology    Dg Chest Port 1 View  Result Date: 10/19/2017 CLINICAL DATA:  Short of breath EXAM: PORTABLE CHEST 1 VIEW COMPARISON:  10/18/2017 FINDINGS: The heart is moderately enlarged. Vascular congestion. Central basilar airspace edema. No pneumothorax. Left costophrenic angle is obscured suggesting pleural effusion. IMPRESSION: CHF with pulmonary edema and left pleural effusion. Electronically Signed   By: Marybelle Killings M.D.   On: 10/19/2017 10:44   Dg Chest Port 1 View  Result Date: 10/18/2017 CLINICAL DATA:  Shortness of breath EXAM: PORTABLE CHEST 1 VIEW COMPARISON:  October 16, 2017 FINDINGS: There is airspace consolidation with volume loss in the left lower lobe, increased from recent prior study. Elsewhere, there is mild interstitial edema. There is cardiomegaly with pulmonary venous hypertension. There is a small left pleural effusion. No adenopathy evident. No bone lesions. IMPRESSION: Increase in airspace consolidation left lower lobe, likely pneumonia. Underlying pulmonary vascular congestion with small left pleural effusion and interstitial pulmonary edema. Electronically Signed   By: Lowella Grip III M.D.    On: 10/18/2017 10:37    Cardiac Studies   ECHO:   - Left ventricle: The cavity size was normal. Systolic function was   normal. Wall motion was normal; there were no regional wall   motion abnormalities. Doppler parameters are consistent with   abnormal left ventricular relaxation (grade 1 diastolic   dysfunction). - Ventricular septum: There was a congenital ventricular septal   defectin the perimembranous region. - Aortic valve: There was no regurgitation. - Mitral valve: There was trivial regurgitation. - Left atrium: The atrium was moderately to severely dilated. - Right ventricle: The cavity size was moderately dilated. Wall   thickness was moderately increased. There was moderate   hypertrophy. Systolic function was moderately reduced. - Right atrium: The atrium was massively dilated. - Atrial septum: There was a medium-sized atrial septal defect. - Tricuspid valve: There was mild-moderate regurgitation. - Pulmonic valve: There was trivial regurgitation. - Pulmonary arteries: PA peak pressure: 79 mm Hg (S). - Pericardium, extracardiac: A trivial pericardial effusion was   identified posterior to the heart.  Patient Profile     52 y.o. 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    Assessment & Plan    PULM HTN :  He is on inhaled prostacyclin, oral ET antagonist and riociguat which have apparently been his stable drugs for years.    Will follow with [facility] as an outpatient.      ACUTE ON CHRONIC RESPIRATORY FAILURE:  Responded well to diuresis.  Started Xarelto yesterday.  Stopped eplerenone.  Stopped Bystolic.   Down 6.5 liters since admission.   Continue current therapy.     For questions or updates, please contact [place] Please consult [email or web address] for contact info under Cardiology/STEMI.    Signed, Minus Breeding, MD  10/19/2017, 3:11 PM

## 2017-10-19 NOTE — Progress Notes (Addendum)
_0 @        PROGRESS NOTE                                                                                                                                                                                                             Patient Demographics:    Jimmy King, is a 52 y.o. male, DOB - 18-Jan-1966, HEN:[id number]  Admit date - 10/16/2017   Admitting Physician Jimmy Farmer, MD  Outpatient Primary MD for the patient is System, Pcp Not In  LOS - 3  Chief Complaint  Patient presents with  . Leg Swelling       Brief Narrative   52 year old male visiting brother from [place] with PMH of Down's syndrome, pulmonary hypertension on home O2 4L, CAD, VSD (never repaired), OSA on home BiPAP, hypothyroidism, gout, and TIA (on Xarelto) who presented to [facility] ED with complaints of swollen legs, distended abdomen, wet cough, and shortness of breath. He was admitted by pulmonary critical care required intubation, he has not been diuresed and extubated and transferred to a care the hospitalist service on 10/19/2017.   Subjective:    Jimmy King today has, No headache, No chest pain, No abdominal pain - No Nausea, No new weakness tingling or numbness, No Cough - SOB.    Assessment  & Plan :     1.  Acute on chronic hypoxic respiratory failure related to severe underlying pulmonary hypertension along with volume overload likely caused by unrepaired congenital VSD causing some acute on chronic diastolic decompensation left ventricle EF preserved.   He has been diuresed adequately and extubated, volume status appears stable, his chronic medications which are xarelto, letarisis 55m qd, Tyvaso, riociguat 2.528mBID, and selexipag 16003mBID are being continued, cardiology following pulmonary has signed off.  Discussed case in detail bedside with patient's sister-in-law on 10/19/2017.  Continue supportive care and monitor.  Advance activity, PT eval likely discharge in 1 to 2 days.  2.  Severe pulmonary  hypertension with chronic hypoxic respiratory failure.  On home oxygen at home and nighttime BiPAP, continue.     3.  Hypothyroidism.  Continue home dose Synthroid.  4.  GERD.  On PPI continue.  5.  History of depression.  On home dose Zoloft.  No acute issues.  6.  HX of TIA in the presence of unrepaired VSD has been maintained on Xarelto which will be continued.  7. OSA - QHS BiPAP  8.  Baseline soft blood pressures.  Currently systolic between 90 and  100, continue to monitor with ongoing diuresis.  Likely hypotensive due to chronic but severe underlying pulmonary hypertension.    Family Communication  : Sister-in-law at bedside  Code Status : DNR  Disposition Plan  : Medical bed  Consults  : PCCM  Procedures  :    Intubation on 10/16/2017 extubated 10/18/2017.  TTE - - Perimembranous VSD and ASD present. Dopplers suggest possible bidirectional flow. Estimated PA peak pressure 79 mmHg. RV hypertrophy with chamber dilation. Biatrial enlargement. Normal LV EF with diastolic dysfunction.   DVT Prophylaxis  : Xarelto  Lab Results  Component Value Date   PLT 140 (L) 10/19/2017    Diet :  Diet Order            Diet 2 gram sodium Room service appropriate? Yes; Fluid consistency: Thin; Fluid restriction: 1500 mL Fluid  Diet effective now               Inpatient Medications Scheduled Meds: . allopurinol  300 mg Oral Daily  . ambrisentan  10 mg Oral Daily  . furosemide  40 mg Oral Daily  . levothyroxine  88 mcg Oral QAC breakfast  . mouth rinse  15 mL Mouth Rinse BID  . midodrine  10 mg Oral TID WC  . pantoprazole  40 mg Oral Daily  . Riociguat  2.5 mg Oral TID  . rivaroxaban  20 mg Oral Q supper  . Selexipag  1,600 mcg Oral BID  . sertraline  50 mg Oral Daily  . Treprostinil  72 mcg Inhalation QID   Continuous Infusions: PRN Meds:.lip balm, liver oil-zinc oxide  Antibiotics  :   Anti-infectives (From admission, onward)   None          Objective:    Vitals:   10/19/17 0900 10/19/17 1000 10/19/17 1137 10/19/17 1200  BP: (!) 80/52 (!) 79/50 95/63 100/60  Pulse: (!) 54   (!) 57  Resp: _0 Temp:   98 F (36.7 C)   TempSrc:   Oral   SpO2: 99%   95%  Weight:      Height:        Wt Readings from Last 3 Encounters:  10/18/17 65.9 kg     Intake/Output Summary (Last 24 hours) at 10/19/2017 1300 Last data filed at 10/19/2017 0800 Gross per 24 hour  Intake 687.5 ml  Output 1725 ml  Net -1037.5 ml     Physical Exam  Awake Alert,  No new F.N deficits, Normal affect [place].AT,PERRAL Supple Neck,No JVD, No cervical lymphadenopathy appriciated.  Symmetrical Chest wall movement, Good air movement bilaterally, CTAB RRR,No Gallops,Rubs or new Murmurs, No Parasternal Heave +ve B.Sounds, Abd Soft, No tenderness, No organomegaly appriciated, No rebound - guarding or rigidity. No Cyanosis, Clubbing or edema, No new Rash or bruise       Data Review:    CBC Recent Labs  Lab 10/16/17 1748 10/17/17 0420 10/18/17 0351 10/19/17 0250  WBC 3.7* 3.1* 2.5* 2.4*  HGB 14.7 13.7 14.0 14.4  HCT 44.7 42.7 43.0 45.2  PLT 103* 109* 137* 140*  MCV 92.7 93.6 93.5 93.6  MCH 30.5 30.0 30.4 29.8  MCHC 32.9 32.1 32.6 31.9  RDW 16.9* 16.4* 16.2* 16.3*  LYMPHSABS 0.7  --   --  0.7  MONOABS 0.4  --   --  0.4  EOSABS 0.0  --   --  0.0  BASOSABS 0.0  --   --  0.0    Chemistries  Recent Labs  Lab 10/16/17 1748 10/17/17 0420 10/18/17 0351 10/19/17 0250  NA 142 143 141 141  K 3.5 3.5 3.8 3.6  CL 106 106 103 102  CO2 _0 32  GLUCOSE 95 102* 81 103*  BUN _1 CREATININE 1.40* 1.46* 1.27* 1.47*  CALCIUM 7.9* 7.6* 7.8* 7.8*  MG  --  2.1  --  2.3  AST 21  --   --  16  ALT 11  --   --  9  ALKPHOS 36*  --   --  29*  BILITOT 0.9  --   --  0.9   ------------------------------------------------------------------------------------------------------------------ No results for input(s): CHOL, HDL, LDLCALC, TRIG, CHOLHDL,  LDLDIRECT in the last 72 hours.  No results found for: HGBA1C ------------------------------------------------------------------------------------------------------------------ Recent Labs    10/16/17 2256  TSH 11.655*   ------------------------------------------------------------------------------------------------------------------ No results for input(s): VITAMINB12, FOLATE, FERRITIN, TIBC, IRON, RETICCTPCT in the last 72 hours.  Coagulation profile Recent Labs  Lab 10/19/17 0250  INR 2.51    No results for input(s): DDIMER in the last 72 hours.  Cardiac Enzymes Recent Labs  Lab 10/16/17 2256 10/17/17 0420 10/17/17 0816  TROPONINI <0.03 0.03* 0.03*   ------------------------------------------------------------------------------------------------------------------    Component Value Date/Time   BNP 527.1 (H) 10/16/2017 1748    Micro Results Recent Results (from the past 240 hour(s))  MRSA PCR Screening     Status: None   Collection Time: 10/16/17 10:22 PM  Result Value Ref Range Status   MRSA by PCR NEGATIVE NEGATIVE Final    Comment:        The GeneXpert MRSA Assay (FDA approved for NASAL specimens only), is one component of a comprehensive MRSA colonization surveillance program. It is not intended to diagnose MRSA infection nor to guide or monitor treatment for MRSA infections. Performed at [facility], [place] [address] [place], [place] [address]     Radiology Reports Dg Chest Port 1 View  Result Date: 10/19/2017 CLINICAL DATA:  Short of breath EXAM: PORTABLE CHEST 1 VIEW COMPARISON:  10/18/2017 FINDINGS: The heart is moderately enlarged. Vascular congestion. Central basilar airspace edema. No pneumothorax. Left costophrenic angle is obscured suggesting pleural effusion. IMPRESSION: CHF with pulmonary edema and left pleural effusion. Electronically Signed   By: Marybelle Killings M.D.   On: 10/19/2017 10:44   Dg Chest Port 1 View  Result Date:  10/18/2017 CLINICAL DATA:  Shortness of breath EXAM: PORTABLE CHEST 1 VIEW COMPARISON:  October 16, 2017 FINDINGS: There is airspace consolidation with volume loss in the left lower lobe, increased from recent prior study. Elsewhere, there is mild interstitial edema. There is cardiomegaly with pulmonary venous hypertension. There is a small left pleural effusion. No adenopathy evident. No bone lesions. IMPRESSION: Increase in airspace consolidation left lower lobe, likely pneumonia. Underlying pulmonary vascular congestion with small left pleural effusion and interstitial pulmonary edema. Electronically Signed   By: Lowella Grip III M.D.   On: 10/18/2017 10:37   Dg Chest Portable 1 View  Result Date: 10/16/2017 CLINICAL DATA:  Shortness of breath and leg swelling. EXAM: PORTABLE CHEST 1 VIEW COMPARISON:  None. FINDINGS: Cardiomegaly. Increased interstitial opacities bilaterally. Atelectasis in the right base. Probable small effusions. No other acute abnormalities. IMPRESSION: The constellation of findings is most consistent with cardiomegaly, small effusions, and pulmonary edema. Electronically Signed   By: Dorise Bullion III M.D   On: 10/16/2017 18:01    Time Spent in minutes  Dean  M.D on 10/19/2017 at 1:00 PM  To page go to [email or web address] - password [email or web address]

## 2017-10-19 NOTE — Progress Notes (Signed)
Jimmy King  MMN:[id number] DOB: 03-Jul-1965 DOA: 10/16/2017 PCP: System, Pcp Not In    LOS: 3 days   Reason for Consult / Chief Complaint:  Hypoxic respiratory failure and hypervolemia   Consulting MD and date of consult:  Dr. Maryan King 10/16/2017  HPI/summary of hospital stay:  HPI obtained from medical chart review, patient's brother, and medical records brought by patient's brother from [place], 22.    53 year old male visiting brother from [place] with [place] of Down's syndrome, pulmonary hypertension on home O2 4L, CAD, VSD (never repaired), OSA on home BiPAP, hypothyroidism, gout, and TIA (on Xarelto) who presented to [facility] ED with complaints of swollen legs, distended abdomen, wet cough, and shortness of breath.  His brother picked him up in [place] on Tuesday and noted he had some swelling and wet cough at that time but since has progressed.  His brother also states patient chronically has low blood pressure with SBP in the 80-90's.  Denies fever.  States patient never complains.  No reports of bleeding except for busted lip in which his brother states wont heal because patient keeps picking at.  Patient's is primarily managed by Dr. Leane King in [place], [place] [phone]).   In the ER, he was noted to be tachypneic, afebrile, above average SBP for patient, sats 79% on 4L, and physical exam with evidence of fluid overload.  Labs noted for WBC 3.7, Hgb 14.7, Plts 103, sCr 1.40 (baseline 1.5), BNP 527, troponin 0.03, CXR showing pulmonary edema and cardiomegaly.   VBG noted at 7.341/ 52.7/ 27/ 28.5.  He was given lasix and placed on BiPAP with improvement in work of breathing and saturation.  He is to be transferred to [facility] for further evaluation and management, PCCM to admit.  9/3>> Diuresed 2.9 L, can tolerate breaks from BiPap on 6 L [place]>> Lasix to po dosing 9/4 talked to the brother who is a dentist consider putting him Lasix brother mentioned the patient is not compliant I told him to go on  Lasix 40 mg p.o. daily for week then go to 20 was mentioning that cardiology mentioned another diuretic dose and regimen that I have to explore.  Subjective  No complaints per patient Currently on 6 L [place] for brief break from BiPAP Very pleasant States he can breath better  Assessment & Plan:  Severe Pulmonary Hypertension (mixed  WHO group 1 and 2 per records ) with acute volume overload  Hx VSD (not repaired) - on xarelto, letarisis 85m qd, Tyvaso, riociguat 2.523mBID, and selexipag 160066mBID - s/p lasix 57m39m ER with good UOP, 1 occurrence of incontinence - LFTs wnl  - brother thinks patient was taken off lasix 20mg27mly around 3 months ago secondary to hypotension Negative 2.9 L overnight 9/3 Some hypotension with diuresis P:  Appreciate cards input Trend troponin and EKG TTE done PA pressures in the 70s to 80s Hold Xarelto (last dose 9/1), Continue heparin per pharmacy Continue Gentle diuresis daily with po lasix since patient will be RV dependent; per Cards Monitor for hypotension closely with continued gentle diuresis  Will add Midodrine 5 mg TID for hypotension Strict I/O's/ daily wts   Trend  mag and phos, trend renal function Resume letarisis, Tyvaso, riociguat, and selexipag Consider resuming home eplerenone  Acute on chronic hypoxic respiratory failure related to pulmonary hypertension and hypervolemia OSA on home HS bipap - baseline home O2 of 4L  P:  Continue BiPAP as needed, and mandatory  qHS Goal sats 90-92% Continue gentle diuresis Trend CXR to guide diuresis  Reported Hypotension - reports that patient runs on the lower side, SBP 80-90 - has been normotensive here - MAP goal is >65 mm Hg P:  Transfer to stepdown unit Resume home bystolic 2.33 mg daily with holding parameters and eplerenone Start midodrine 10 mg to support the blood pressure  Creatinine 1.46 with Diuresis up from 1.4 Plan: Trend BMET Avoid nephrotoxic medications  Maintain  renal perfusion Replete electrolytes as needed  Trend Mag and Phos  Leukopenia  Thrombocytopenia  P:  Trend CBC SCDs Stop heparin start Xarelto   Hypothyroidism  TSH 11.655 on 9/3 P:  Synthroid daily Will check T4, may need to increase synthroid dose  Depression P:  Resume home zoloft   Pulmonary critical care will sign off please call us with questions patient is back to his baseline on the oxygen requirements seem to be comfortable his blood pressure is always soft keep that in mind please call us with question thank you will sign off  Best Practice / Goals of Care / Disposition.   DVT prophylaxis: SCDs, heparin  GI prophylaxis: Protonix  Diet: NPO, except meds Mobility: bedrest Code Status: DNR Family Communication: Patient's brother, Jimmy King [phone]) updated at bedside  Disposition / Summary of Today's Plan 10/19/17   Transfer to stepdown unit  Consultants: date of consult/date signed off (if applicable)/final recs  Cardiology/ HF  Procedures:  Significant Diagnostic Tests: 9/2 CXR>> The constellation of findings is most consistent with cardiomegaly, small effusions, and pulmonary edema.  Micro Data: 9/2 MRSA PCR >>  Antimicrobials:  none  Objective    Examination: General:  Adult male with Down syndrome features sitting in bed in NAD on [place] at 6 L HEENT: MM pink/dry, dried blood to lips, pupils 4/reactive, anicteric, +JVD Neuro: Alert, f/c, MAE, very pleasant CV: S1, S2, rrr, +murmur, no RG PULM: even/non-labored, lungs bilaterally coarse, faint exp wheeze, scattered rales throughout FG:BMSX, distended, non-tender, bs active  Extremities: warm/dry, +2 BLE edema - no asymmetry noted , no obvious deformities Skin: Intact, warm and dry, no rashes lesions noted  Blood pressure (!) 85/55, pulse (!) 59, temperature 98.6 F (37 C), temperature source Oral, resp. rate 11, height _0  (1.727 m), weight 65.9 kg, SpO2 99 %.        Intake/Output  Summary (Last 24 hours) at 10/19/2017 1007 Last data filed at 10/19/2017 0800 Gross per 24 hour  Intake 1325 ml  Output 1775 ml  Net -450 ml   Filed Weights   10/16/17 1724 10/16/17 2155 10/18/17 0500  Weight: 70.7 kg 69.6 kg 65.9 kg     Labs    CBC: Recent Labs  Lab 10/16/17 1748 10/17/17 0420 10/18/17 0351 10/19/17 0250  WBC 3.7* 3.1* 2.5* 2.4*  NEUTROABS 2.6  --   --  1.2*  HGB 14.7 13.7 14.0 14.4  HCT 44.7 42.7 43.0 45.2  MCV 92.7 93.6 93.5 93.6  PLT 103* 109* 137* 115*   Basic Metabolic Panel: Recent Labs  Lab 10/16/17 1748 10/17/17 0420 10/18/17 0351 10/19/17 0250  NA 142 143 141 141  K 3.5 3.5 3.8 3.6  CL 106 106 103 102  CO2 _1 32  GLUCOSE 95 102* 81 103*  BUN _2 CREATININE 1.40* 1.46* 1.27* 1.47*  CALCIUM 7.9* 7.6* 7.8* 7.8*  MG  --  2.1  --  2.3  PHOS  --  3.1  --   --  GFR: Estimated Creatinine Clearance: 56 mL/min (A) (by C-G formula based on SCr of 1.47 mg/dL (H)). Recent Labs  Lab 10/16/17 1748 10/17/17 0420 10/18/17 0351 10/19/17 0250  WBC 3.7* 3.1* 2.5* 2.4*   Liver Function Tests: Recent Labs  Lab 10/16/17 1748 10/17/17 0420 10/19/17 0250  AST 21  --  16  ALT 11  --  9  ALKPHOS 36*  --  29*  BILITOT 0.9  --  0.9  PROT 6.6  --  5.3*  ALBUMIN 3.6 2.8* 2.8*   No results for input(s): LIPASE, AMYLASE in the last 168 hours. No results for input(s): AMMONIA in the last 168 hours. ABG    Component Value Date/Time   PHART 7.451 (H) 10/19/2017 0512   PCO2ART 46.1 10/19/2017 0512   PO2ART 74.6 (L) 10/19/2017 0512   HCO3 31.6 (H) 10/19/2017 0512   TCO2 30 10/16/2017 1754   O2SAT 95.3 10/19/2017 0512    Coagulation Profile: Recent Labs  Lab 10/19/17 0250  INR 2.51   Cardiac Enzymes: Recent Labs  Lab 10/16/17 1748 10/16/17 2256 10/17/17 0420 10/17/17 0816  TROPONINI 0.03* <0.03 0.03* 0.03*   HbA1C: No results found for: HGBA1C CBG: No results for input(s): GLUCAP in the last 168  hours.   Allergies No Known Allergies  Home meds  Prior to Admission medications   Medication Sig Start Date End Date Taking? Authorizing Provider  allopurinol (ZYLOPRIM) 300 MG tablet Take 300 mg by mouth daily.   Yes [provider]  ambrisentan (LETAIRIS) 10 MG tablet Take by mouth daily.   Yes [provider]  eplerenone (INSPRA) 25 MG tablet Take 12.5 mg by mouth daily.   Yes [provider]  levothyroxine (SYNTHROID, LEVOTHROID) 88 MCG tablet Take 88 mcg by mouth daily before breakfast.   Yes [provider]  nebivolol (BYSTOLIC) 2.5 MG tablet Take 2.5 mg by mouth daily.   Yes [provider]  pantoprazole (PROTONIX) 40 MG tablet Take 40 mg by mouth daily.   Yes [provider]  Riociguat (ADEMPAS) 2.5 MG TABS Take by mouth 3 (three) times daily.   Yes [provider]  Rivaroxaban (XARELTO) 15 MG TABS tablet Take 15 mg by mouth every evening.   Yes [provider]  Selexipag (UPTRAVI) 1600 MCG TABS Take by mouth 2 (two) times daily.   Yes [provider]  sertraline (ZOLOFT) 50 MG tablet Take 50 mg by mouth daily.   Yes [provider]    Rollene Fare MD  [facility] Pgr: [phone] 10/19/2017, 10:07 AM

## 2017-10-19 NOTE — Evaluation (Signed)
Physical Therapy Evaluation Patient Details Name: Jimmy King MRN: [id number] DOB: 1966/01/28 Today's Date: 10/19/2017   History of Present Illness  52 year old male visiting brother from [place] with PMH of Down's syndrome, pulmonary hypertension on home O2 4L, CAD, VSD (never repaired), OSA on home BiPAP, hypothyroidism, gout, and TIA (on Xarelto) who presented to [facility] ED with complaints of swollen legs, distended abdomen, wet cough, and shortness of breath.  Clinical Impression  Pt admitted with above diagnosis. Pt currently with functional limitations due to the deficits listed below (see PT Problem List). Pt was able to ambulate in halls with out physical assist but with cues for pursed lip breathing.  Sister in law reports that pt is close to his baseline gait and used 4LO2 PTA.  She reports that she and her husband will be caring for pt on d/c and provide 24 hour care.   Pt will benefit from skilled PT to increase their independence and safety with mobility to allow discharge to the venue listed below.      Follow Up Recommendations No PT follow up;Supervision/Assistance - 24 hour    Equipment Recommendations  None recommended by PT    Recommendations for Other Services       Precautions / Restrictions Precautions Precautions: Fall Restrictions Weight Bearing Restrictions: No      Mobility  Bed Mobility Overal bed mobility: Independent                Transfers Overall transfer level: Needs assistance   Transfers: Sit to/from Stand Sit to Stand: Supervision            Ambulation/Gait Ambulation/Gait assistance: Min guard Gait Distance (Feet): 100 Feet(several standing rest breaks) Assistive device: None Gait Pattern/deviations: Decreased stride length;Step-through pattern   Gait velocity interpretation: <1.31 ft/sec, indicative of household ambulator General Gait Details: Pt on 6LO2 on arrival.  Sister in law states that pt used 4LO2 PTA.  Pt needed cues for  pursed lip breathing as pt desat to 84-86% on 4L unless constantly pursed lip breathing.  Pt left on 6LO2 once back to room as his sats were low 90's.  Nurse made aware.  Gait slow and guarded which sister in law states is how pt ambulated PTA.  No physical assist needed for ambulation.   Stairs            Wheelchair Mobility    Modified Rankin (Stroke Patients Only)       Balance Overall balance assessment: Needs assistance Sitting-balance support: No upper extremity supported;Feet supported Sitting balance-Leahy Scale: Fair     Standing balance support: No upper extremity supported;During functional activity Standing balance-Leahy Scale: Fair Standing balance comment: can stand statically without UE support                             Pertinent Vitals/Pain Pain Assessment: No/denies pain    Home Living Family/patient expects to be discharged to:: Private residence Living Arrangements: Other relatives(sisster in law) Available Help at Discharge: Family;Available 24 hours/day Type of Home: House Home Access: Stairs to enter Entrance Stairs-Rails: Left Entrance Stairs-Number of Steps: 3 Home Layout: One level Home Equipment: Shower seat - built in(home O2 - 4L)      Prior Function Level of Independence: Independent               Hand Dominance        Extremity/Trunk Assessment   Upper Extremity Assessment Upper Extremity  Assessment: Defer to OT evaluation    Lower Extremity Assessment Lower Extremity Assessment: Overall WFL for tasks assessed    Cervical / Trunk Assessment Cervical / Trunk Assessment: Normal  Communication   Communication: (doesnt talk alot)  Cognition Arousal/Alertness: Awake/alert Behavior During Therapy: Flat affect Overall Cognitive Status: History of cognitive impairments - at baseline                                 General Comments: sister in law states pt has some dementia and delayed response  to questions.       General Comments      Exercises     Assessment/Plan    PT Assessment Patient needs continued PT services  PT Problem List Decreased activity tolerance;Decreased balance;Decreased mobility;Decreased knowledge of use of DME;Decreased safety awareness;Decreased knowledge of precautions;Cardiopulmonary status limiting activity       PT Treatment Interventions DME instruction;Gait training;Functional mobility training;Therapeutic activities;Therapeutic exercise;Stair training;Balance training;Patient/family education    PT Goals (Current goals can be found in the Care Plan section)  Acute Rehab PT Goals Patient Stated Goal: to go to brothers home PT Goal Formulation: With patient/family Time For Goal Achievement: 11/02/17 Potential to Achieve Goals: Good    Frequency Min 3X/week   Barriers to discharge        Co-evaluation               AM-PAC PT "6 Clicks" Daily Activity  Outcome Measure Difficulty turning over in bed (including adjusting bedclothes, sheets and blankets)?: None Difficulty moving from lying on back to sitting on the side of the bed? : None Difficulty sitting down on and standing up from a chair with arms (e.g., wheelchair, bedside commode, etc,.)?: None Help needed moving to and from a bed to chair (including a wheelchair)?: None Help needed walking in hospital room?: A Little Help needed climbing 3-5 steps with a railing? : A Little 6 Click Score: 22    End of Session Equipment Utilized During Treatment: Gait belt;Oxygen Activity Tolerance: Patient limited by fatigue Patient left: in chair;with call bell/phone within reach;with chair alarm set;with family/visitor present Nurse Communication: Mobility status PT Visit Diagnosis: Unsteadiness on feet (R26.81);Muscle weakness (generalized) (M62.81)    Time: 4920-1007 PT Time Calculation (min) (ACUTE ONLY): 27 min   Charges:   PT Evaluation $PT Eval Moderate Complexity: 1  Mod PT Treatments $Gait Training: 8-22 mins        [place] Pager:  [phone]  Office:  [place] 10/19/2017, 12:08 PM

## 2017-10-20 DIAGNOSIS — J9600 Acute respiratory failure, unspecified whether with hypoxia or hypercapnia: Secondary | ICD-10-CM

## 2017-10-20 DIAGNOSIS — J9601 Acute respiratory failure with hypoxia: Secondary | ICD-10-CM

## 2017-10-20 LAB — BASIC METABOLIC PANEL
ANION GAP: 7 (ref 5–15)
BUN: 14 mg/dL (ref 6–20)
CO2: 28 mmol/L (ref 22–32)
CREATININE: 1.32 mg/dL — AB (ref 0.61–1.24)
Calcium: 8.3 mg/dL — ABNORMAL LOW (ref 8.9–10.3)
Chloride: 107 mmol/L (ref 98–111)
GFR calc Af Amer: >60 mL/min (ref >60)
GLUCOSE: 91 mg/dL (ref 70–99)
Potassium: 3.6 mmol/L (ref 3.5–5.1)
Sodium: 142 mmol/L (ref 135–145)

## 2017-10-20 LAB — MAGNESIUM: Magnesium: 2.3 mg/dL (ref 1.7–2.4)

## 2017-10-20 MED ORDER — FUROSEMIDE 40 MG PO TABS: 40.0000 mg | ORAL_TABLET | Freq: Every day | ORAL | 0 refills | Status: DC

## 2017-10-20 MED ORDER — MIDODRINE HCL 10 MG PO TABS: 10.0000 mg | ORAL_TABLET | Freq: Three times a day (TID) | ORAL | 0 refills | Status: DC

## 2017-10-20 NOTE — Discharge Instructions (Signed)
Follow with Primary MD in 7 days   Get CBC, CMP, 2 view Chest X ray checked  by Primary MD in 5-7 days    Activity: As tolerated with Full fall precautions use walker/cane & assistance as needed  Disposition Home   Diet:   Heart Healthy with 1.5 L/day total fluid restriction  For Heart failure patients - Check your Weight same time everyday, if you gain over 2 pounds, or you develop in leg swelling, experience more shortness of breath or chest pain, call your Primary MD immediately. Follow Cardiac Low Salt Diet and 1.5 lit/day fluid restriction.  Special Instructions: If you have smoked or chewed Tobacco  in the last 2 yrs please stop smoking, stop any regular Alcohol  and or any Recreational drug use.  On your next visit with your primary care physician please Get Medicines reviewed and adjusted.  Please request your Prim.MD to go over all Hospital Tests and Procedure/Radiological results at the follow up, please get all Hospital records sent to your Prim MD by signing hospital release before you go home.  If you experience worsening of your admission symptoms, develop shortness of breath, life threatening emergency, suicidal or homicidal thoughts you must seek medical attention immediately by calling 911 or calling your MD immediately  if symptoms less severe.

## 2017-10-20 NOTE — Discharge Summary (Signed)
Jimmy King BRK:[id number] DOB: 1965-10-06 DOA: 10/16/2017  PCP: System, Pcp Not In  Admit date: 10/16/2017  Discharge date: 10/20/2017  Admitted From: Home  Disposition:  Home   Recommendations for Outpatient Follow-up:   Follow up with PCP in 1-2 weeks  PCP Please obtain BMP/CBC, 2 view CXR in 1week,  (see Discharge instructions)   PCP Please follow up on the following pending results:    Home Health: RN,PT   Equipment/Devices: None  Consultations: Cards, PCCm Discharge Condition: Fair   CODE STATUS: DNR   Diet Recommendation:  Heart Healthy with strict 1.5 L/day total fluid restriction   Chief Complaint  Patient presents with  . Leg Swelling     Brief history of present illness from the day of admission and additional interim summary    52 year old male visiting brother from [place] with PMH of Down's syndrome, pulmonary hypertension on home O2 4L, CAD, VSD (never repaired),OSA on home BiPAP,hypothyroidism, gout, and TIA (on Xarelto) who presented to [facility] ED with complaints of swollen legs, distended abdomen, wet cough, and shortness of breath. He was admitted by pulmonary critical care required intubation, he has not been diuresed and extubated and transferred to a care the hospitalist service on 10/19/2017.                                                                 Hospital Course    1.  Acute on chronic hypoxic respiratory failure related to severe underlying pulmonary hypertension along with volume overload likely caused by unrepaired congenital VSD causing some acute on chronic diastolic decompensation left ventricle EF preserved.   He has been diuresed adequately and extubated, volume status appears stable, his chronic medications which are xarelto, letarisis 23m qd,Tyvaso,riociguat 2.554mBID, and  selexipag 160058mBID are being continued, cardiology following pulmonary has signed off.  Discussed case in detail bedside with patient's sister-in-law on 10/19/2017 and brother on 10/20/2017, explained that this is likely advanced and end-stage right-sided heart failure and pulmonary hypertension.    He is currently stable and symptom-free and close to his baseline, his edema has almost completely resolved, appropriate medication changes have been made as below as recommended by cardiology, he will be discharged on 40 mg of Lasix daily along with midodrine 10 3 times daily, his home medications Bystolic and Epresoline have been discontinued per cardiology.  He is currently compensated, will be discharged with outpatient PCP and cardiology follow-up.  He uses 4 to 5 L nasal cannula oxygen at home and daytime along with nighttime BiPAP.   2.  Severe pulmonary hypertension with chronic hypoxic respiratory failure.  On home oxygen 4-5 L at home and nighttime BiPAP, continue.     3.  Hypothyroidism.  Continue home dose Synthroid.  4.  GERD.  On PPI  continue.  5.  History of depression.  On home dose Zoloft.  No acute issues.  6.  HX of TIA in the presence of unrepaired VSD has been maintained on Xarelto which will be continued.  7. OSA - QHS BiPAP  8.  Baseline soft blood pressures.  Currently systolic between 90 and 825, continue to monitor with ongoing diuresis.  Likely hypotensive due to chronic but severe underlying pulmonary hypertension.  Midodrine has been added.  PCP to continue to monitor.    Discharge diagnosis     Active Problems:   CHF (congestive heart failure) (HCC)   Pulmonary hypertension (HCC)   Pulmonary edema   Acute on chronic respiratory failure with hypoxemia (HCC)   Hypotension   Acute respiratory failure with hypoxia (HCC)   Acute respiratory failure (HCC)    Discharge instructions    Discharge Instructions    Diet - low sodium heart healthy   Complete  by:  As directed    Discharge instructions   Complete by:  As directed    Follow with Primary MD in 7 days   Get CBC, CMP, 2 view Chest X ray checked  by Primary MD in 5-7 days    Activity: As tolerated with Full fall precautions use walker/cane & assistance as needed  Disposition Home   Diet:   Heart Healthy with 1.5 L/day total fluid restriction  For Heart failure patients - Check your Weight same time everyday, if you gain over 2 pounds, or you develop in leg swelling, experience more shortness of breath or chest pain, call your Primary MD immediately. Follow Cardiac Low Salt Diet and 1.5 lit/day fluid restriction.  Special Instructions: If you have smoked or chewed Tobacco  in the last 2 yrs please stop smoking, stop any regular Alcohol  and or any Recreational drug use.  On your next visit with your primary care physician please Get Medicines reviewed and adjusted.  Please request your Prim.MD to go over all Hospital Tests and Procedure/Radiological results at the follow up, please get all Hospital records sent to your Prim MD by signing hospital release before you go home.  If you experience worsening of your admission symptoms, develop shortness of breath, life threatening emergency, suicidal or homicidal thoughts you must seek medical attention immediately by calling 911 or calling your MD immediately  if symptoms less severe.   Increase activity slowly   Complete by:  As directed       Discharge Medications   Allergies as of 10/20/2017   No Known Allergies     Medication List    STOP taking these medications   eplerenone 25 MG tablet Commonly known as:  INSPRA   nebivolol 2.5 MG tablet Commonly known as:  BYSTOLIC     TAKE these medications   ADEMPAS 2.5 MG Tabs Generic drug:  Riociguat Take by mouth 3 (three) times daily.   allopurinol 300 MG tablet Commonly known as:  ZYLOPRIM Take 300 mg by mouth daily.   ambrisentan 10 MG tablet Commonly known as:   LETAIRIS Take by mouth daily.   furosemide 40 MG tablet Commonly known as:  LASIX Take 1 tablet (40 mg total) by mouth daily. Start taking on:  10/21/2017   levothyroxine 88 MCG tablet Commonly known as:  SYNTHROID, LEVOTHROID Take 88 mcg by mouth daily before breakfast.   midodrine 10 MG tablet Commonly known as:  PROAMATINE Take 1 tablet (10 mg total) by mouth 3 (three) times daily with meals.  pantoprazole 40 MG tablet Commonly known as:  PROTONIX Take 40 mg by mouth daily.   phenol 1.4 % Liqd Commonly known as:  CHLORASEPTIC Use as directed 1 spray in the mouth or throat as needed for throat irritation / pain.   Rivaroxaban 15 MG Tabs tablet Commonly known as:  XARELTO Take 15 mg by mouth every evening.   sertraline 50 MG tablet Commonly known as:  ZOLOFT Take 50 mg by mouth daily.   TYVASO 0.6 MG/ML Soln Generic drug:  Treprostinil Inhale 24 mcg into the lungs 4 (four) times daily.   UPTRAVI 1600 MCG Tabs Generic drug:  Selexipag Take by mouth 2 (two) times daily.       Follow-up Information    Bensimhon, Shaune Pascal, MD Follow up on 11/01/2017.   Specialty:  Cardiology Why:  Please arrive 20 minutes early for your 3:20pm appointment to establish care for your pulmonary hyptertension. Use the valet parking at the main entrance of the hospital and someone will escort you to the clinic location.  Contact information: [address] [place] [address] [place]. Schedule an appointment as soon as possible for a visit in 1 week(s).   Why:  Or your PCP of choice locally Contact information: [place] [address] [phone]          Major procedures and Radiology Reports - PLEASE review detailed and final reports thoroughly  -     Intubation on 10/16/2017 extubated 10/18/2017.  TTE - - Perimembranous VSD and ASD present. Dopplers suggest possible  bidirectional flow. Estimated PA peak pressure 79 mmHg. RVhypertrophy with chamber dilation. Biatrial enlargement. Normal LV EF with diastolic dysfunction.    Dg Chest Port 1 View  Result Date: 10/19/2017 CLINICAL DATA:  Short of breath EXAM: PORTABLE CHEST 1 VIEW COMPARISON:  10/18/2017 FINDINGS: The heart is moderately enlarged. Vascular congestion. Central basilar airspace edema. No pneumothorax. Left costophrenic angle is obscured suggesting pleural effusion. IMPRESSION: CHF with pulmonary edema and left pleural effusion. Electronically Signed   By: Marybelle Killings M.D.   On: 10/19/2017 10:44   Dg Chest Port 1 View  Result Date: 10/18/2017 CLINICAL DATA:  Shortness of breath EXAM: PORTABLE CHEST 1 VIEW COMPARISON:  October 16, 2017 FINDINGS: There is airspace consolidation with volume loss in the left lower lobe, increased from recent prior study. Elsewhere, there is mild interstitial edema. There is cardiomegaly with pulmonary venous hypertension. There is a small left pleural effusion. No adenopathy evident. No bone lesions. IMPRESSION: Increase in airspace consolidation left lower lobe, likely pneumonia. Underlying pulmonary vascular congestion with small left pleural effusion and interstitial pulmonary edema. Electronically Signed   By: Lowella Grip III M.D.   On: 10/18/2017 10:37   Dg Chest Portable 1 View  Result Date: 10/16/2017 CLINICAL DATA:  Shortness of breath and leg swelling. EXAM: PORTABLE CHEST 1 VIEW COMPARISON:  None. FINDINGS: Cardiomegaly. Increased interstitial opacities bilaterally. Atelectasis in the right base. Probable small effusions. No other acute abnormalities. IMPRESSION: The constellation of findings is most consistent with cardiomegaly, small effusions, and pulmonary edema. Electronically Signed   By: Dorise Bullion III M.D   On: 10/16/2017 18:01    Micro Results    Recent Results (from the past 240 hour(s))  MRSA PCR Screening     Status: None   Collection  Time: 10/16/17 10:22 PM  Result Value Ref Range Status  MRSA by PCR NEGATIVE NEGATIVE Final    Comment:        The GeneXpert MRSA Assay (FDA approved for NASAL specimens only), is one component of a comprehensive MRSA colonization surveillance program. It is not intended to diagnose MRSA infection nor to guide or monitor treatment for MRSA infections. Performed at [facility], [place] [address], [place], [place] [address]     Today   Subjective    Jimmy King today in bed about to eat breakfast, appears to be in no distress, denies any headache chest or shortness of breath.   Objective   Blood pressure (!) 121/95, pulse 72, temperature 97.8 F (36.6 C), temperature source Oral, resp. rate 13, height _0  (1.727 m), weight 65.9 kg, SpO2 97 %.   Intake/Output Summary (Last 24 hours) at 10/20/2017 1038 Last data filed at 10/20/2017 0000 Gross per 24 hour  Intake 200 ml  Output 1400 ml  Net -1200 ml    Exam  Awake Alert,  No new F.N deficits,   [place].AT,PERRAL Supple Neck,No JVD, No cervical lymphadenopathy appriciated.  Symmetrical Chest wall movement, Good air movement bilaterally, CTAB RRR,No Gallops,Rubs or new Murmurs, No Parasternal Heave +ve B.Sounds, Abd Soft, Non tender, No organomegaly appriciated, No rebound -guarding or rigidity. No Cyanosis, Clubbing or edema, No new Rash or bruise   Data Review   CBC w Diff:  Lab Results  Component Value Date   WBC 2.4 (L) 10/19/2017   HGB 14.4 10/19/2017   HCT 45.2 10/19/2017   PLT 140 (L) 10/19/2017   LYMPHOPCT 28 10/19/2017   MONOPCT 17 10/19/2017   EOSPCT 2 10/19/2017   BASOPCT 1 10/19/2017    CMP:  Lab Results  Component Value Date   NA 142 10/20/2017   K 3.6 10/20/2017   CL 107 10/20/2017   CO2 28 10/20/2017   BUN 14 10/20/2017   CREATININE 1.32 (H) 10/20/2017   PROT 5.3 (L) 10/19/2017   ALBUMIN 2.8 (L) 10/19/2017   BILITOT 0.9 10/19/2017   ALKPHOS 29 (L) 10/19/2017   AST 16 10/19/2017   ALT  9 10/19/2017  .   Total Time in preparing paper work, data evaluation and todays exam - 32 minutes  Lala Lund M.D on 10/20/2017 at 10:38 AM  [facility]   Office  [phone]

## 2017-10-20 NOTE — Progress Notes (Signed)
Progress Note  Patient Name: Jimmy King Date of Encounter: 10/20/2017  Primary Cardiologist:   No primary care provider on file.   Subjective   No SOB.  No pain.   Inpatient Medications    Scheduled Meds: . allopurinol  300 mg Oral Daily  . ambrisentan  10 mg Oral Daily  . furosemide  40 mg Oral Daily  . levothyroxine  88 mcg Oral QAC breakfast  . mouth rinse  15 mL Mouth Rinse BID  . midodrine  10 mg Oral TID WC  . pantoprazole  40 mg Oral Daily  . Riociguat  2.5 mg Oral TID  . rivaroxaban  20 mg Oral Q supper  . Selexipag  1,600 mcg Oral BID  . sertraline  50 mg Oral Daily  . Treprostinil  72 mcg Inhalation QID   Continuous Infusions:  PRN Meds:    Vital Signs    Vitals:   10/20/17 0332 10/20/17 0350 10/20/17 0400 10/20/17 0737  BP:  (!) 88/56  (!) 121/95  Pulse: (!) 56 (!) 58  72  Resp: _0 Temp:   98.2 F (36.8 C) 97.8 F (36.6 C)  TempSrc:   Axillary Oral  SpO2: 94% 94%  97%  Weight:      Height:        Intake/Output Summary (Last 24 hours) at 10/20/2017 1027 Last data filed at 10/20/2017 0000 Gross per 24 hour  Intake 200 ml  Output 1400 ml  Net -1200 ml   Filed Weights   10/16/17 1724 10/16/17 2155 10/18/17 0500  Weight: 70.7 kg 69.6 kg 65.9 kg    Telemetry    NSR - Personally Reviewed  ECG    NA - Personally Reviewed  Physical Exam   GEN: No  acute distress.   Neck:   Positive JVD Cardiac: RRR, 3/6 harsh systolic murmur, no diastolic murmurs, rubs, or gallops.  Respiratory: Clear   to auscultation bilaterally. GI: Soft, nontender, non-distended, normal bowel sounds  MS:  No edema; No deformity. Neuro:   Nonfocal  Psych: Appropriate    Labs    Chemistry Recent Labs  Lab 10/16/17 1748 10/17/17 0420 10/18/17 0351 10/19/17 0250 10/20/17 0610  NA 142 143 141 141 142  K 3.5 3.5 3.8 3.6 3.6  CL 106 106 103 102 107  CO2 _1 32 28  GLUCOSE 95 102* 81 103* 91  BUN _2 CREATININE 1.40* 1.46* 1.27*  1.47* 1.32*  CALCIUM 7.9* 7.6* 7.8* 7.8* 8.3*  PROT 6.6  --   --  5.3*  --   ALBUMIN 3.6 2.8*  --  2.8*  --   AST 21  --   --  16  --   ALT 11  --   --  9  --   ALKPHOS 36*  --   --  29*  --   BILITOT 0.9  --   --  0.9  --   GFRNONAA 57* 54* >60 54* >60  GFRAA >60 >60 >60 >60 >60  ANIONGAP _3 Hematology Recent Labs  Lab 10/17/17 0420 10/18/17 0351 10/19/17 0250  WBC 3.1* 2.5* 2.4*  RBC 4.56 4.60 4.83  HGB 13.7 14.0 14.4  HCT 42.7 43.0 45.2  MCV 93.6 93.5 93.6  MCH 30.0 30.4 29.8  MCHC 32.1 32.6 31.9  RDW 16.4* 16.2* 16.3*  PLT 109* 137* 140*    Cardiac Enzymes Recent Labs  Lab 10/16/17 1748 10/16/17 2256 10/17/17 0420 10/17/17 0816  TROPONINI 0.03* <0.03 0.03* 0.03*   No results for input(s): TROPIPOC in the last 168 hours.   BNP Recent Labs  Lab 10/16/17 1748  BNP 527.1*     DDimer No results for input(s): DDIMER in the last 168 hours.   Radiology    Dg Chest Port 1 View  Result Date: 10/19/2017 CLINICAL DATA:  Short of breath EXAM: PORTABLE CHEST 1 VIEW COMPARISON:  10/18/2017 FINDINGS: The heart is moderately enlarged. Vascular congestion. Central basilar airspace edema. No pneumothorax. Left costophrenic angle is obscured suggesting pleural effusion. IMPRESSION: CHF with pulmonary edema and left pleural effusion. Electronically Signed   By: Marybelle Killings M.D.   On: 10/19/2017 10:44    Cardiac Studies   ECHO:   - Left ventricle: The cavity size was normal. Systolic function was   normal. Wall motion was normal; there were no regional wall   motion abnormalities. Doppler parameters are consistent with   abnormal left ventricular relaxation (grade 1 diastolic   dysfunction). - Ventricular septum: There was a congenital ventricular septal   defectin the perimembranous region. - Aortic valve: There was no regurgitation. - Mitral valve: There was trivial regurgitation. - Left atrium: The atrium was moderately to severely dilated. - Right  ventricle: The cavity size was moderately dilated. Wall   thickness was moderately increased. There was moderate   hypertrophy. Systolic function was moderately reduced. - Right atrium: The atrium was massively dilated. - Atrial septum: There was a medium-sized atrial septal defect. - Tricuspid valve: There was mild-moderate regurgitation. - Pulmonic valve: There was trivial regurgitation. - Pulmonary arteries: PA peak pressure: 79 mm Hg (S). - Pericardium, extracardiac: A trivial pericardial effusion was   identified posterior to the heart.  Patient Profile     52 y.o. 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    Assessment & Plan    PULM HTN :    He continues on his previous therapy.  We will arrange follow up in the [facility].   ACUTE ON CHRONIC RESPIRATORY FAILURE:      Home on Lasix 40 mg but I discussed with his brother (who is a Pharmacist, community) that we need to be very aware of BP/orthostic symptoms.     For questions or updates, please contact [place] Please consult [email or web address] for contact info under Cardiology/STEMI.   Signed, Minus Breeding, MD  10/20/2017, 10:27 AM

## 2017-10-20 NOTE — Care Management Note (Signed)
Case Management Note  Patient Details  Name: Jimmy King MRN: [id number] Date of Birth: October 13, 1965  Subjective/Objective: Pt presented for leg swelling-volume overload. Acute on chronic hypoxic respiratory failure. PTA from home with [place].                  Action/Plan: CM did consult for [place] refused services. CM did make patient/ brother aware if needed in the future to contact PCP. No further needs from CM at this time.   Expected Discharge Date:  10/20/17               Expected Discharge Plan:  Home/Self Care  In-House Referral:  NA  Discharge planning Services  CM Consult  Post Acute Care Choice:  Home Health Choice offered to:  NA  DME Arranged:  N/A DME Agency:  NA  HH Arranged:  Patient Refused HH(Brother Chartered loss adjuster Guardian Refused [facility] ) [place] Agency:  NA  Status of Service:  Completed, signed off  If discussed at [facility] of Stay Meetings, dates discussed:    Additional Comments:  Bethena Roys, RN 10/20/2017, 11:33 AM

## 2017-11-01 ENCOUNTER — Other Ambulatory Visit: Payer: Self-pay

## 2017-11-01 ENCOUNTER — Ambulatory Visit (HOSPITAL_COMMUNITY)
Admit: 2017-11-01 | Discharge: 2017-11-01 | Disposition: A | Discharge status: Home / Self Care | Payer: Medicare Other | Attending: Internal Medicine | Admitting: Internal Medicine

## 2017-11-01 ENCOUNTER — Encounter (HOSPITAL_COMMUNITY): Payer: Self-pay | Admitting: Internal Medicine

## 2017-11-01 VITALS — BP 85/57 | HR 64 | Wt 133.4 lb

## 2017-11-01 DIAGNOSIS — Z7901 Long term (current) use of anticoagulants: Secondary | ICD-10-CM | POA: Diagnosis not present

## 2017-11-01 DIAGNOSIS — Z66 Do not resuscitate: Secondary | ICD-10-CM | POA: Diagnosis not present

## 2017-11-01 DIAGNOSIS — F0390 Unspecified dementia without behavioral disturbance: Secondary | ICD-10-CM | POA: Diagnosis not present

## 2017-11-01 DIAGNOSIS — I5032 Chronic diastolic (congestive) heart failure: Secondary | ICD-10-CM | POA: Diagnosis not present

## 2017-11-01 DIAGNOSIS — Q211 Atrial septal defect, unspecified: Secondary | ICD-10-CM

## 2017-11-01 DIAGNOSIS — M109 Gout, unspecified: Secondary | ICD-10-CM | POA: Diagnosis not present

## 2017-11-01 DIAGNOSIS — I2510 Atherosclerotic heart disease of native coronary artery without angina pectoris: Secondary | ICD-10-CM | POA: Diagnosis present

## 2017-11-01 DIAGNOSIS — Q210 Ventricular septal defect: Secondary | ICD-10-CM | POA: Diagnosis not present

## 2017-11-01 DIAGNOSIS — J9610 Chronic respiratory failure, unspecified whether with hypoxia or hypercapnia: Secondary | ICD-10-CM | POA: Insufficient documentation

## 2017-11-01 DIAGNOSIS — E039 Hypothyroidism, unspecified: Secondary | ICD-10-CM | POA: Diagnosis not present

## 2017-11-01 DIAGNOSIS — Q909 Down syndrome, unspecified: Secondary | ICD-10-CM | POA: Diagnosis not present

## 2017-11-01 DIAGNOSIS — G4733 Obstructive sleep apnea (adult) (pediatric): Secondary | ICD-10-CM | POA: Insufficient documentation

## 2017-11-01 DIAGNOSIS — Z79899 Other long term (current) drug therapy: Secondary | ICD-10-CM | POA: Diagnosis not present

## 2017-11-01 DIAGNOSIS — I272 Pulmonary hypertension, unspecified: Secondary | ICD-10-CM | POA: Diagnosis not present

## 2017-11-01 DIAGNOSIS — I2783 Eisenmenger's syndrome: Secondary | ICD-10-CM

## 2017-11-01 DIAGNOSIS — Z8673 Personal history of transient ischemic attack (TIA), and cerebral infarction without residual deficits: Secondary | ICD-10-CM | POA: Insufficient documentation

## 2017-11-01 DIAGNOSIS — Z9981 Dependence on supplemental oxygen: Secondary | ICD-10-CM | POA: Diagnosis not present

## 2017-11-01 LAB — COMPREHENSIVE METABOLIC PANEL
ALT: 9 U/L (ref 0–44)
ANION GAP: 8 (ref 5–15)
AST: 18 U/L (ref 15–41)
Albumin: 3.6 g/dL (ref 3.5–5.0)
Alkaline Phosphatase: 38 U/L (ref 38–126)
BILIRUBIN TOTAL: 0.8 mg/dL (ref 0.3–1.2)
BUN: 23 mg/dL — ABNORMAL HIGH (ref 6–20)
CHLORIDE: 106 mmol/L (ref 98–111)
CO2: 28 mmol/L (ref 22–32)
Calcium: 8.8 mg/dL — ABNORMAL LOW (ref 8.9–10.3)
Creatinine, Ser: 1.29 mg/dL — ABNORMAL HIGH (ref 0.61–1.24)
Glucose, Bld: 89 mg/dL (ref 70–99)
POTASSIUM: 4 mmol/L (ref 3.5–5.1)
Sodium: 142 mmol/L (ref 135–145)
TOTAL PROTEIN: 6.8 g/dL (ref 6.5–8.1)

## 2017-11-01 LAB — BRAIN NATRIURETIC PEPTIDE: B Natriuretic Peptide: 231 pg/mL — ABNORMAL HIGH (ref 0.0–100.0)

## 2017-11-01 NOTE — Progress Notes (Signed)
Advanced [facility] Failure Clinic Consult Note   Referring Physician: Roby Lofts, PA PCP: System, Pcp Not In PCP-Cardiologist: No primary care provider on file.   HPI: Jimmy King is a 52 y.o. male with a history of Down's syndrome, dementia, pulmonary HTN on 4L home O2 with Eisenmenger Syndrome, CAD, VSD and ASD (not repaired), OSA on BiPAP, hypothyroidism, gout and TIA in 2005 on Xarelto.  He recently moved from [place], [place] to [place] to live with brother and sister-in-law. He was previously living with his other brother. His [place] physician was Dr Adela Ports.   Recently admitted 9/2-10/20/17 with A/C hypoxic respiratory failure. Required intubation and IV diuresis. Letaris, tyvaso, riociguat, and selexipag were continued. Cardiology and pulmonary followed and thought he was end stage right sided HF and pulmonary HTN. He was discharged on 40 mg lasix daily + midodrine 10 mg TID. He is a DNR. He has been referred to the [facility] for furthe management of his [place].   Echo 10/17/17: Perimembranous VSD and ASD present. Dopplers suggest possible   bidirectional flow. Estimated PA peak pressure 79 mmHg. RV   hypertrophy with chamber dilation. Biatrial enlargement. Normal   LV EF with diastolic dysfunction.  He presents today to establish care in the HF clinic. He is here with his sister-in-law, who provides most of the information today. He can communicate but cannot provide much history other than to say that he feels better  Overall doing okay. Able to walk around the house with no SOB. No orthopnea or edema. No cough. No dizziness or CP. No presyncope or syncope. Uses WC when outside of house to prevent low O2. Wearing CPAP qHS. Sister in law checks vitals and weight 2x/day. O2 80-84% on 4L [place]. Weights 131-134 lbs. SBP 85-90s. Takes all medication now. Not sure if he was getting them regularly in [place]. Limits fluid and salt. Has multiple episodes of urinary incontinence.   FH: mother died of cancer,  father had heart problems and stroke  SH: No ETOH, tobacco, drug use. Has medicare. Has mailorder for medications.   Review of Systems: [y] = yes, _0  = no   General: Weight gain _1 ; Weight loss _2 ; Anorexia _3 ; Fatigue _4 ; Fever _5 ; Chills _6 ; Weakness _7   Cardiac: Chest pain/pressure _8 ; Resting SOB _9 ; Exertional SOB _10 ; Orthopnea _11 ; Pedal Edema [ y]; Palpitations _12 ; Syncope _13 ; Presyncope _14 ; Paroxysmal nocturnal dyspnea_15   Pulmonary: Cough _16 ; Wheezing_17 ; Hemoptysis_18 ; Sputum _19 ; Snoring _20   GI: Vomiting_21 ; Dysphagia_22 ; Melena_23 ; Hematochezia _24 ; Heartburn_25 ; Abdominal pain _26 ; Constipation _27 ; Diarrhea _28 ; BRBPR _29   GU: Hematuria_30 ; Dysuria _31 ; Nocturia_32   Vascular: Pain in legs with walking _33 ; Pain in feet with lying flat _34 ; Non-healing sores _35 ; Stroke _36 ; TIA [ y]; Slurred speech _37 ;  Neuro: Headaches_38 ; Vertigo_39 ; Seizures_40 ; Paresthesias_41 ;Blurred vision _42 ; Diplopia _43 ; Vision changes _44   Ortho/Skin: Arthritis _45 ; Joint pain _46 ; Muscle pain _47 ; Joint swelling _48 ; Back Pain _49 ; Rash _50   Psych: Depression_51 ; Anxiety_52   Heme: Bleeding problems _53 ; Clotting disorders _54 ; Anemia _55   Endocrine: Diabetes _56 ; Thyroid dysfunction_57    Past Medical History:  Diagnosis Date  . Coronary artery stenosis   . Down syndrome   . Pulmonary  hypertension ([place])   . TIA (transient ischemic attack)   . Ventricular septal defect     Current Outpatient Medications  Medication Sig Dispense Refill  . allopurinol (ZYLOPRIM) 300 MG tablet Take 300 mg by mouth daily.    Marland Kitchen ambrisentan (LETAIRIS) 10 MG tablet Take by mouth daily.    . furosemide (LASIX) 20 MG tablet Take 20 mg by mouth daily.    Marland Kitchen levothyroxine (SYNTHROID, LEVOTHROID) 88 MCG tablet Take 88 mcg by mouth daily before breakfast.    . midodrine (PROAMATINE) 10 MG tablet Take 1 tablet (10 mg total) by mouth 3 (three) times daily with meals. 90 tablet 0  . pantoprazole (PROTONIX)  40 MG tablet Take 40 mg by mouth daily.    . phenol (CHLORASEPTIC) 1.4 % LIQD Use as directed 1 spray in the mouth or throat as needed for throat irritation / pain.    . Riociguat (ADEMPAS) 2.5 MG TABS Take by mouth 3 (three) times daily.    . Rivaroxaban (XARELTO) 15 MG TABS tablet Take 15 mg by mouth every evening.    . Selexipag (UPTRAVI) 1600 MCG TABS Take by mouth 2 (two) times daily.    . sertraline (ZOLOFT) 50 MG tablet Take 50 mg by mouth daily.    . Treprostinil (TYVASO) 0.6 MG/ML SOLN Inhale 24 mcg into the lungs 4 (four) times daily.     No current facility-administered medications for this encounter.     No Known Allergies    Social History   Socioeconomic History  . Marital status: Single    Spouse name: Not on file  . Number of children: Not on file  . Years of education: Not on file  . Highest education level: Not on file  Occupational History  . Not on file  Social Needs  . Financial resource strain: Not on file  . Food insecurity:    Worry: Not on file    Inability: Not on file  . Transportation needs:    Medical: Not on file    Non-medical: Not on file  Tobacco Use  . Smoking status: Never Smoker  . Smokeless tobacco: Never Used  Substance and Sexual Activity  . Alcohol use: Never    Frequency: Never  . Drug use: Never  . Sexual activity: Never  Lifestyle  . Physical activity:    Days per week: Not on file    Minutes per session: Not on file  . Stress: Not on file  Relationships  . Social connections:    Talks on phone: Not on file    Gets together: Not on file    Attends religious service: Not on file    Active member of club or organization: Not on file    Attends meetings of clubs or organizations: Not on file    Relationship status: Not on file  . Intimate partner violence:    Fear of current or ex partner: Not on file    Emotionally abused: Not on file    Physically abused: Not on file    Forced sexual activity: Not on file  Other  Topics Concern  . Not on file  Social History Narrative  . Not on file     No family history on file.  Vitals:   11/01/17 1529  BP: (!) 85/57  Pulse: 64  SpO2: 93%  Weight: 60.5 kg (133 lb 6 oz)     PHYSICAL EXAM: General:  No respiratory difficulty HEENT: normal anicterics Neck: supple. JVP ~8  with prominent CV waves. Carotids 2+ bilat; no bruits. No lymphadenopathy or thyromegaly appreciated. Cor: PMI nondisplaced. Regular rate & rhythm. 3/6 TR loud P2 Lungs: clear. On 4L [place] Abdomen: soft, nontender, nondistended. No hepatosplenomegaly. No bruits or masses. Good bowel sounds. Extremities: no cyanosis, rash, edema. Mild clubbing Neuro: alert & oriented x 3, cranial nerves grossly intact. moves all 4 extremities w/o difficulty. Affect pleasant  ECG:   ASSESSMENT & PLAN:  1. PAH - group 1I do not see notes or any procedures in care everywhere. Has VSD. Borderline Eisenmenger syndrome. - Continue letaris 10 mg daily - Continue riociguat 2.5 mg TID - Continue selexipag 1600 mcg BID - Continue Tyvaso 24 mcg QID  2. Diastolic HF with RV failure - Echo 10/17/17: EF normal (no number given), grade 1 DD, RV moderately reduced with moderate hypertrophy - Volume stable on exam - Continue lasix 20 mg daily with extra 20 mg PRN - Continue midodrine 10 mg TID  3. Chronic respiratory failure - Continue home O2 4-5 L - Continue BiPAP qHS  4. TIA - Continue Xarelto. Denies bleeding  5. VSD   Jimmy Shore, NP 11/01/17  52 y/o male with Down's syndrome with longstanding, unrepaired ASD and VSD with Group I PAH and Eisenmenger's syndrome. He is much improved after recent admit for diuresis. He is on triple therapy with letairis, riociquat and selexipag but he is also on an inhaled prostaglandin (Tyvaso). I typically would not use 2 prostanoids but given the fact that he has done so well on them and is not having dose-limiting SEs, I will not change at this point. Volume status  looks good. Will check labs and continue current therapy.  Jimmy Bickers, MD  10:38 PM

## 2017-11-01 NOTE — Patient Instructions (Signed)
Labs today  We will contact you in 4 months to schedule your next appointment.  

## 2017-11-17 ENCOUNTER — Other Ambulatory Visit (HOSPITAL_COMMUNITY): Payer: Self-pay

## 2017-11-17 MED ORDER — MIDODRINE HCL 10 MG PO TABS: 10.0000 mg | ORAL_TABLET | Freq: Three times a day (TID) | ORAL | 3 refills | Status: DC

## 2017-11-17 MED ORDER — FUROSEMIDE 20 MG PO TABS: 20.0000 mg | ORAL_TABLET | Freq: Every day | ORAL | 3 refills | Status: AC

## 2018-04-16 ENCOUNTER — Other Ambulatory Visit (HOSPITAL_COMMUNITY): Payer: Self-pay | Admitting: Internal Medicine

## 2018-06-08 ENCOUNTER — Encounter (HOSPITAL_COMMUNITY): Payer: PRIVATE HEALTH INSURANCE | Admitting: Internal Medicine

## 2018-06-18 ENCOUNTER — Other Ambulatory Visit (HOSPITAL_COMMUNITY): Payer: Self-pay | Admitting: Internal Medicine

## 2018-07-12 ENCOUNTER — Telehealth (HOSPITAL_COMMUNITY): Payer: Self-pay | Admitting: *Deleted

## 2018-07-12 NOTE — Telephone Encounter (Signed)
Received VM from family member stating pt had been vomiting "Jamieson Hetland stuff" and couldn't remember if he took his medications today. I called back to get more information and no answer left message for them to call back.

## 2018-08-14 ENCOUNTER — Other Ambulatory Visit (HOSPITAL_COMMUNITY): Payer: Self-pay | Admitting: Cardiology

## 2018-10-15 ENCOUNTER — Other Ambulatory Visit (HOSPITAL_COMMUNITY): Payer: Self-pay | Admitting: Internal Medicine

## 2018-11-13 ENCOUNTER — Other Ambulatory Visit (HOSPITAL_COMMUNITY): Payer: Self-pay | Admitting: Internal Medicine

## 2018-11-14 ENCOUNTER — Other Ambulatory Visit (HOSPITAL_COMMUNITY): Payer: Self-pay

## 2018-12-17 ENCOUNTER — Other Ambulatory Visit (HOSPITAL_COMMUNITY): Payer: Self-pay | Admitting: Internal Medicine

## 2019-01-14 ENCOUNTER — Other Ambulatory Visit (HOSPITAL_COMMUNITY): Payer: Self-pay | Admitting: Internal Medicine

## 2019-02-16 ENCOUNTER — Other Ambulatory Visit (HOSPITAL_COMMUNITY): Payer: Self-pay | Admitting: Internal Medicine

## 2019-03-15 ENCOUNTER — Other Ambulatory Visit (HOSPITAL_COMMUNITY): Payer: Self-pay | Admitting: Internal Medicine

## 2019-04-15 ENCOUNTER — Other Ambulatory Visit (HOSPITAL_COMMUNITY): Payer: Self-pay | Admitting: Internal Medicine

## 2019-05-13 ENCOUNTER — Other Ambulatory Visit (HOSPITAL_COMMUNITY): Payer: Self-pay | Admitting: Internal Medicine

## 2019-05-16 IMAGING — DX DG CHEST 1V PORT
1 series · 1 of 1 positions shown · non-contrast
Comparison: October 16, 2017

CLINICAL DATA: Shortness of breath

EXAM:
PORTABLE CHEST 1 VIEW

[chest ap]
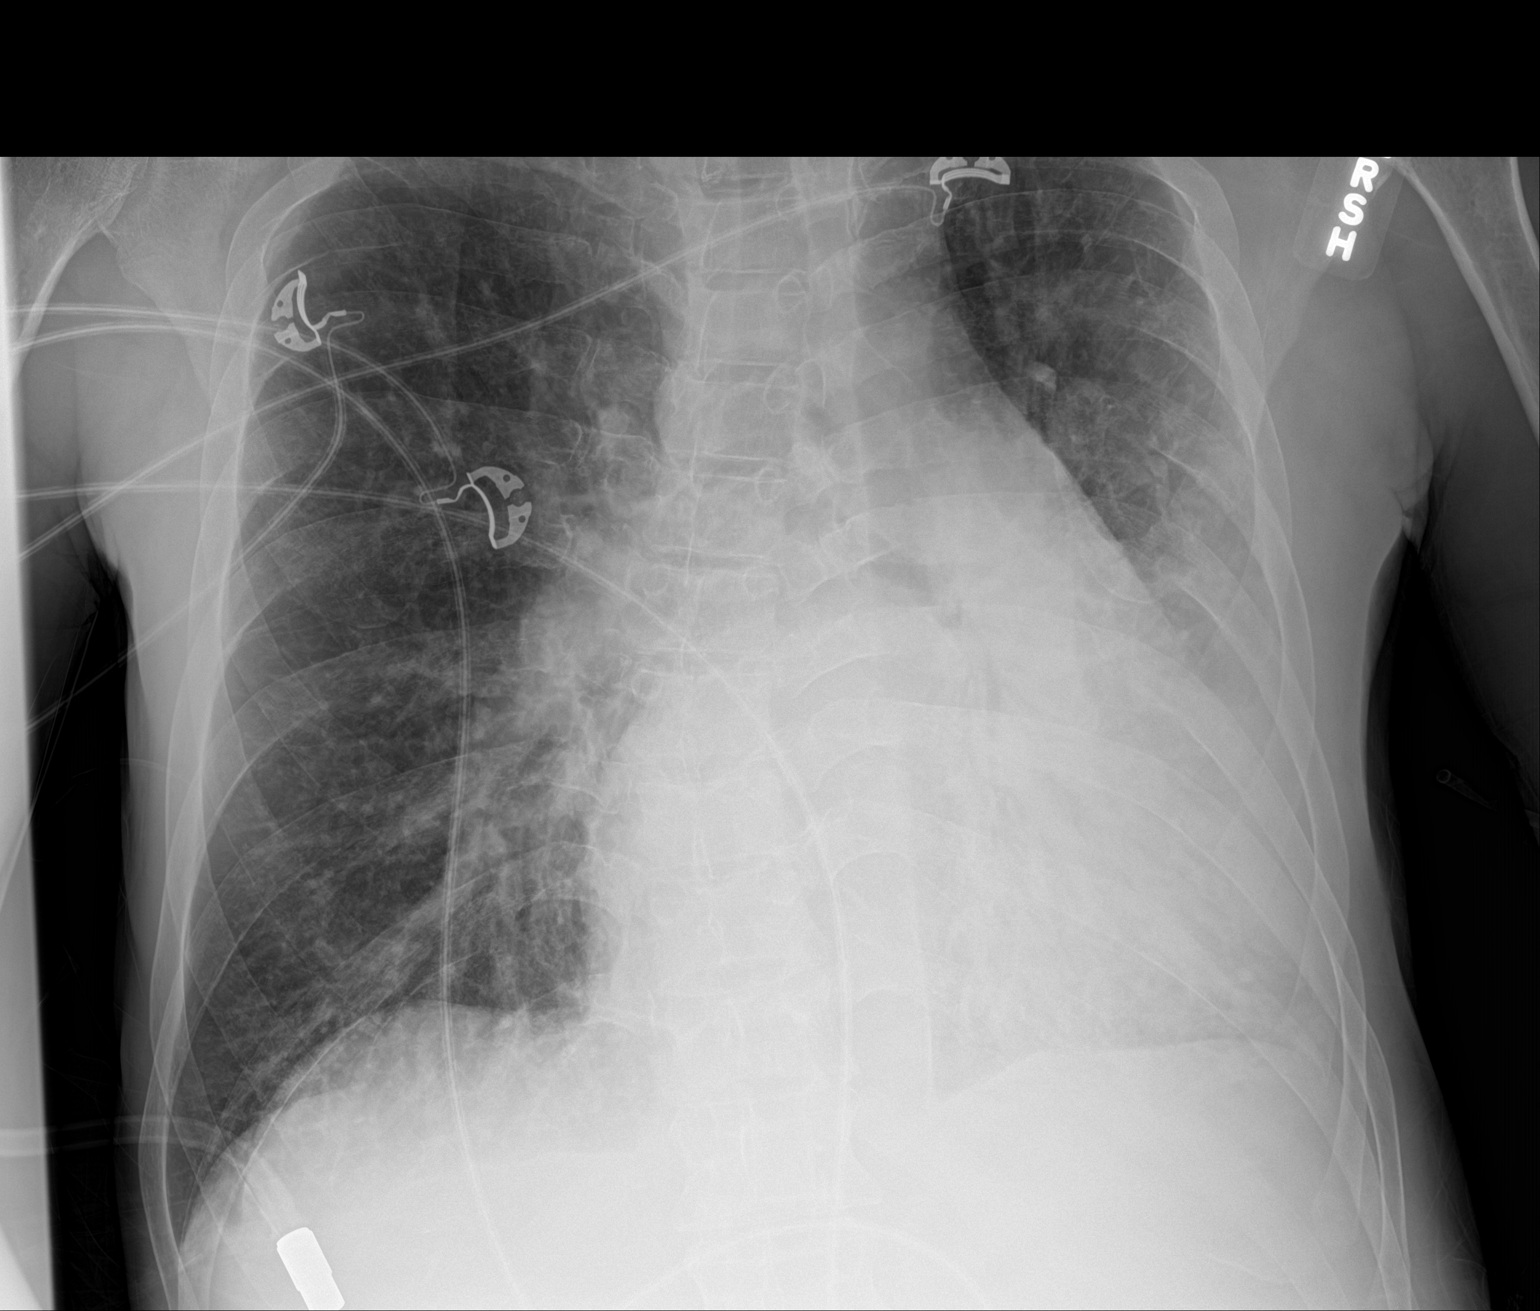

[1 of 1 positions shown; findings below may reference images not displayed]

FINDINGS: There is airspace consolidation with volume loss in the left lower
lobe, increased from recent prior study. Elsewhere, there is mild
interstitial edema. There is cardiomegaly with pulmonary venous
hypertension. There is a small left pleural effusion. No adenopathy
evident. No bone lesions.
IMPRESSION: Increase in airspace consolidation left lower lobe, likely
pneumonia. Underlying pulmonary vascular congestion with small left
pleural effusion and interstitial pulmonary edema.

## 2019-06-17 ENCOUNTER — Other Ambulatory Visit (HOSPITAL_COMMUNITY): Payer: Self-pay | Admitting: Internal Medicine

## 2019-08-13 ENCOUNTER — Other Ambulatory Visit (HOSPITAL_COMMUNITY): Payer: Self-pay | Admitting: Internal Medicine

## 2019-11-03 ENCOUNTER — Emergency Department (HOSPITAL_BASED_OUTPATIENT_CLINIC_OR_DEPARTMENT_OTHER)
Admission: EM | Admit: 2019-11-03 | Discharge: 2019-11-03 | Disposition: A | Discharge status: Home / Self Care | Payer: Medicare Other | Attending: Emergency Medicine | Admitting: Emergency Medicine

## 2019-11-03 ENCOUNTER — Other Ambulatory Visit: Payer: Self-pay

## 2019-11-03 ENCOUNTER — Emergency Department (HOSPITAL_BASED_OUTPATIENT_CLINIC_OR_DEPARTMENT_OTHER): Payer: Medicare Other

## 2019-11-03 DIAGNOSIS — R0602 Shortness of breath: Secondary | ICD-10-CM | POA: Diagnosis present

## 2019-11-03 DIAGNOSIS — J069 Acute upper respiratory infection, unspecified: Secondary | ICD-10-CM | POA: Insufficient documentation

## 2019-11-03 DIAGNOSIS — Z8673 Personal history of transient ischemic attack (TIA), and cerebral infarction without residual deficits: Secondary | ICD-10-CM | POA: Diagnosis not present

## 2019-11-03 DIAGNOSIS — Z20822 Contact with and (suspected) exposure to covid-19: Secondary | ICD-10-CM | POA: Diagnosis not present

## 2019-11-03 DIAGNOSIS — I110 Hypertensive heart disease with heart failure: Secondary | ICD-10-CM | POA: Insufficient documentation

## 2019-11-03 DIAGNOSIS — I509 Heart failure, unspecified: Secondary | ICD-10-CM | POA: Diagnosis not present

## 2019-11-03 DIAGNOSIS — I2510 Atherosclerotic heart disease of native coronary artery without angina pectoris: Secondary | ICD-10-CM | POA: Diagnosis not present

## 2019-11-03 LAB — HEPATIC FUNCTION PANEL
ALT: 9 U/L (ref 0–44)
AST: 17 U/L (ref 15–41)
Albumin: 3.6 g/dL (ref 3.5–5.0)
Alkaline Phosphatase: 38 U/L (ref 38–126)
Bilirubin, Direct: 0.1 mg/dL (ref 0.0–0.2)
Indirect Bilirubin: 0.5 mg/dL (ref 0.3–0.9)
Total Bilirubin: 0.6 mg/dL (ref 0.3–1.2)
Total Protein: 6.6 g/dL (ref 6.5–8.1)

## 2019-11-03 LAB — LACTIC ACID, PLASMA: Lactic Acid, Venous: 0.8 mmol/L (ref 0.5–1.9)

## 2019-11-03 LAB — CBC WITH DIFFERENTIAL/PLATELET
Abs Immature Granulocytes: 0.04 10*3/uL (ref 0.00–0.07)
Basophils Absolute: 0 10*3/uL (ref 0.0–0.1)
Basophils Relative: 1 %
Eosinophils Absolute: 0.1 10*3/uL (ref 0.0–0.5)
Eosinophils Relative: 2 %
HCT: 46.9 % (ref 39.0–52.0)
Hemoglobin: 15 g/dL (ref 13.0–17.0)
Immature Granulocytes: 1 %
Lymphocytes Relative: 20 %
Lymphs Abs: 0.8 10*3/uL (ref 0.7–4.0)
MCH: 30.1 pg (ref 26.0–34.0)
MCHC: 32 g/dL (ref 30.0–36.0)
MCV: 94.2 fL (ref 80.0–100.0)
Monocytes Absolute: 0.3 10*3/uL (ref 0.1–1.0)
Monocytes Relative: 8 %
Neutro Abs: 3 10*3/uL (ref 1.7–7.7)
Neutrophils Relative %: 68 %
Platelets: 129 10*3/uL — ABNORMAL LOW (ref 150–400)
RBC: 4.98 MIL/uL (ref 4.22–5.81)
RDW: 16.2 % — ABNORMAL HIGH (ref 11.5–15.5)
WBC: 4.3 10*3/uL (ref 4.0–10.5)
nRBC: 0 % (ref 0.0–0.2)

## 2019-11-03 LAB — BASIC METABOLIC PANEL
Anion gap: 12 (ref 5–15)
BUN: 20 mg/dL (ref 6–20)
CO2: 31 mmol/L (ref 22–32)
Calcium: 8.2 mg/dL — ABNORMAL LOW (ref 8.9–10.3)
Chloride: 100 mmol/L (ref 98–111)
Creatinine, Ser: 1.5 mg/dL — ABNORMAL HIGH (ref 0.61–1.24)
GFR calc Af Amer: >60 mL/min (ref >60)
GFR calc non Af Amer: 53 mL/min — ABNORMAL LOW (ref >60)
Glucose, Bld: 78 mg/dL (ref 70–99)
Potassium: 3.6 mmol/L (ref 3.5–5.1)
Sodium: 143 mmol/L (ref 135–145)

## 2019-11-03 LAB — LIPASE, BLOOD: Lipase: 31 U/L (ref 11–51)

## 2019-11-03 LAB — RESP PANEL BY RT PCR (RSV, FLU A&B, COVID)
Influenza A by PCR: NEGATIVE
Influenza B by PCR: NEGATIVE
Respiratory Syncytial Virus by PCR: NEGATIVE
SARS Coronavirus 2 by RT PCR: NEGATIVE

## 2019-11-03 LAB — BRAIN NATRIURETIC PEPTIDE: B Natriuretic Peptide: 171.2 pg/mL — ABNORMAL HIGH (ref 0.0–100.0)

## 2019-11-03 LAB — TROPONIN I (HIGH SENSITIVITY)
Troponin I (High Sensitivity): 33 ng/L — ABNORMAL HIGH (ref <18)
Troponin I (High Sensitivity): 31 ng/L — ABNORMAL HIGH (ref <18)

## 2019-11-03 MED ORDER — DOXYCYCLINE HYCLATE 100 MG PO CAPS: 100.0000 mg | ORAL_CAPSULE | Freq: Two times a day (BID) | ORAL | 0 refills | Status: AC

## 2019-11-03 NOTE — ED Notes (Signed)
ED Provider at bedside. 

## 2019-11-03 NOTE — ED Provider Notes (Signed)
Emergency Department Provider Note   I have reviewed the triage vital signs and the nursing notes.   HISTORY  Chief Complaint Shortness of Breath   HPI Jimmy King is a 54 y.o. male with PMH of Down's Syndrome, Pulmonary HTN, prior TIA, and CAD presents to the ED with increased O2 requirement at home. Patient is on 4L [place] at baseline. Sats were 70% PTA on this amount of O2.  Patient's caretaker denies any increased work of breathing.  He noticed some congestion over the past several days and thought that he heard some asymmetric breath sounds.  Patient's caretaker is a Pharmacist, community.  No vomiting or diarrhea.  Oral intake continues to be normal for the patient.  Level 5 caveat: Down's Syndrome   Past Medical History:  Diagnosis Date  . Coronary artery stenosis   . Down syndrome   . Pulmonary hypertension ([place])   . TIA (transient ischemic attack)   . Ventricular septal defect     Patient Active Problem List   Diagnosis Date Noted  . Acute respiratory failure with hypoxia ([place])   . Acute respiratory failure ([place])   . Hypotension   . CHF (congestive heart failure) ([place]) 10/16/2017  . Pulmonary hypertension ([place]) 10/16/2017  . Pulmonary edema   . Acute on chronic respiratory failure with hypoxemia ([facility])     No past surgical history on file.  Allergies Patient has no known allergies.  No family history on file.  Social History Social History   Tobacco Use  . Smoking status: Never Smoker  . Smokeless tobacco: Never Used  Substance Use Topics  . Alcohol use: Never  . Drug use: Never    Review of Systems  Constitutional: No fever/chills Eyes: No visual changes. ENT: No sore throat. Cardiovascular: Denies chest pain. Respiratory: Denies shortness of breath. Positive congestion.  Gastrointestinal: No abdominal pain.  No nausea, no vomiting.  No diarrhea.  No constipation. Genitourinary: Negative for dysuria. Musculoskeletal: Negative for back pain. Skin: Negative  for rash. Neurological: Negative for headaches, focal weakness or numbness.  10-point ROS otherwise negative.  ____________________________________________   PHYSICAL EXAM:  VITAL SIGNS: ED Triage Vitals  Enc Vitals Group     BP 11/03/19 1500 95/62     Pulse Rate 11/03/19 1500 65     Resp 11/03/19 1500 18     Temp 11/03/19 1500 98.5 F (36.9 C)     Temp Source 11/03/19 1500 Rectal     SpO2 11/03/19 1435 (!) 70 %   Constitutional: Alert and oriented. Well appearing and in no acute distress. Eyes: Conjunctivae are normal. Head: Atraumatic. Nose: No congestion/rhinnorhea. Mouth/Throat: Mucous membranes are moist.   Neck: No stridor. Cardiovascular: Normal rate, regular rhythm. Good peripheral circulation. Grossly normal heart sounds.   Respiratory: Normal respiratory effort.  No retractions. Lungs with faint crackles at the bases. No wheezing.  Gastrointestinal: Soft and nontender. No distention.  Musculoskeletal: No lower extremity tenderness nor edema. No gross deformities of extremities. Neurologic: No gross focal neurologic deficits are appreciated.  Skin:  Skin is warm, dry and intact. No rash noted.  ____________________________________________   LABS (all labs ordered are listed, but only abnormal results are displayed)  Labs Reviewed  CBC WITH DIFFERENTIAL/PLATELET - Abnormal; Notable for the following components:      Result Value   RDW 16.2 (*)    Platelets 129 (*)    All other components within normal limits  BASIC METABOLIC PANEL - Abnormal; Notable for the following components:  Creatinine, Ser 1.50 (*)    Calcium 8.2 (*)    GFR calc non Af Amer 53 (*)    All other components within normal limits  BRAIN NATRIURETIC PEPTIDE - Abnormal; Notable for the following components:   B Natriuretic Peptide 171.2 (*)    All other components within normal limits  TROPONIN I (HIGH SENSITIVITY) - Abnormal; Notable for the following components:   Troponin I (High  Sensitivity) 33 (*)    All other components within normal limits  TROPONIN I (HIGH SENSITIVITY) - Abnormal; Notable for the following components:   Troponin I (High Sensitivity) 31 (*)    All other components within normal limits  RESP PANEL BY RT PCR (RSV, FLU A&B, COVID)  HEPATIC FUNCTION PANEL  LIPASE, BLOOD  LACTIC ACID, PLASMA   ____________________________________________  EKG   EKG Interpretation  Date/Time:  Sunday November 03 2019 14:41:49 EDT Ventricular Rate:  67 PR Interval:    QRS Duration: 99 QT Interval:  423 QTC Calculation: 447 R Axis:   -116 Text Interpretation: Sinus rhythm Prolonged PR interval Consider right atrial enlargement LAD, consider left anterior fascicular block Consider right ventricular hypertrophy Abnormal T, consider ischemia, anterior leads Similar to prior from 2019 No STEMI Confirmed by Nanda Quinton [id number]) on 11/03/2019 4:29:58 PM      ____________________________________________   PROCEDURES  Procedure(s) performed:   Procedures  CRITICAL CARE Performed by: Margette Fast Total critical care time: 35 minutes Critical care time was exclusive of separately billable procedures and treating other patients. Critical care was necessary to treat or prevent imminent or life-threatening deterioration. Critical care was time spent personally by me on the following activities: development of treatment plan with patient and/or surrogate as well as nursing, discussions with consultants, evaluation of patient's response to treatment, examination of patient, obtaining history from patient or surrogate, ordering and performing treatments and interventions, ordering and review of laboratory studies, ordering and review of radiographic studies, pulse oximetry and re-evaluation of patient's condition.  Nanda Quinton, MD Emergency Medicine  ____________________________________________   INITIAL IMPRESSION / ASSESSMENT AND PLAN / ED COURSE  Pertinent  labs & imaging results that were available during my care of the patient were reviewed by me and considered in my medical decision making (see chart for details).   Patient presents to the emergency department with increased oxygen requirement at home and some congestion.  No increased work of breathing.  Patient is on 5 L nasal cannula my assessment I turned him down to 4 L which is his home oxygen and he is doing well.  Chest x-ray shows no pulmonary edema or focal infiltrate to suspect bacterial pneumonia.  Have sent a respiratory virus panel and additional lab work which is pending.   Respiratory panel reviewed and negative. CXR and labs reviewed. Patient back at his home O2 and comfortable. Ambulatory in the ED without increased WOB. Suspect viral etiology of illness. Caregiver comfortable with discharge home. Provided a watch-and-wait Rx for abx but will hold on treatment for now. Discussed ED return precautions.  ____________________________________________  FINAL CLINICAL IMPRESSION(S) / ED DIAGNOSES  Final diagnoses:  URI, acute    NEW OUTPATIENT MEDICATIONS STARTED DURING THIS VISIT:  Discharge Medication List as of 11/03/2019  5:53 PM    START taking these medications   Details  doxycycline (VIBRAMYCIN) 100 MG capsule Take 1 capsule (100 mg total) by mouth 2 (two) times daily for 7 days., Starting Sun 11/03/2019, Until Sun 11/10/2019, Print  Note:  This document was prepared using Dragon voice recognition software and may include unintentional dictation errors.  Nanda Quinton, MD, [facility] Emergency Medicine    France Noyce, [facility], MD 11/04/19 [phone]

## 2019-11-03 NOTE — ED Notes (Signed)
ED Provider at bedside. Dr. Laverta Baltimore

## 2019-11-03 NOTE — Discharge Instructions (Signed)
You were seen in the emergency room today with cough and lower oxygen at home.  Your oxygen levels have responded well here and are back to normal.  Your x-ray does not show pneumonia and your lab work is reassuring.  I have sent you home with a watch and wait prescription for doxycycline.  You can start this if symptoms worsen but if there is no significant worsening in symptoms or oxygen level I would hold off on additional antibiotic.  Please follow closely with your primary care doctor and return to the emergency department any new or suddenly worsening symptoms.

## 2019-11-03 NOTE — ED Triage Notes (Signed)
Pt brother reports pt has recently had increased need for oxygen, normally uses 4L [place]. Pt on 4L [place] in triage, sats at 70%. also reports congestion, denies cough, denies fever

## 2021-05-31 IMAGING — DX DG CHEST 1V PORT
1 series · 1 of 1 positions shown · non-contrast
Comparison: 10/18/2017

CLINICAL DATA: Shortness of breath

EXAM:
PORTABLE CHEST 1 VIEW

[chest ap]
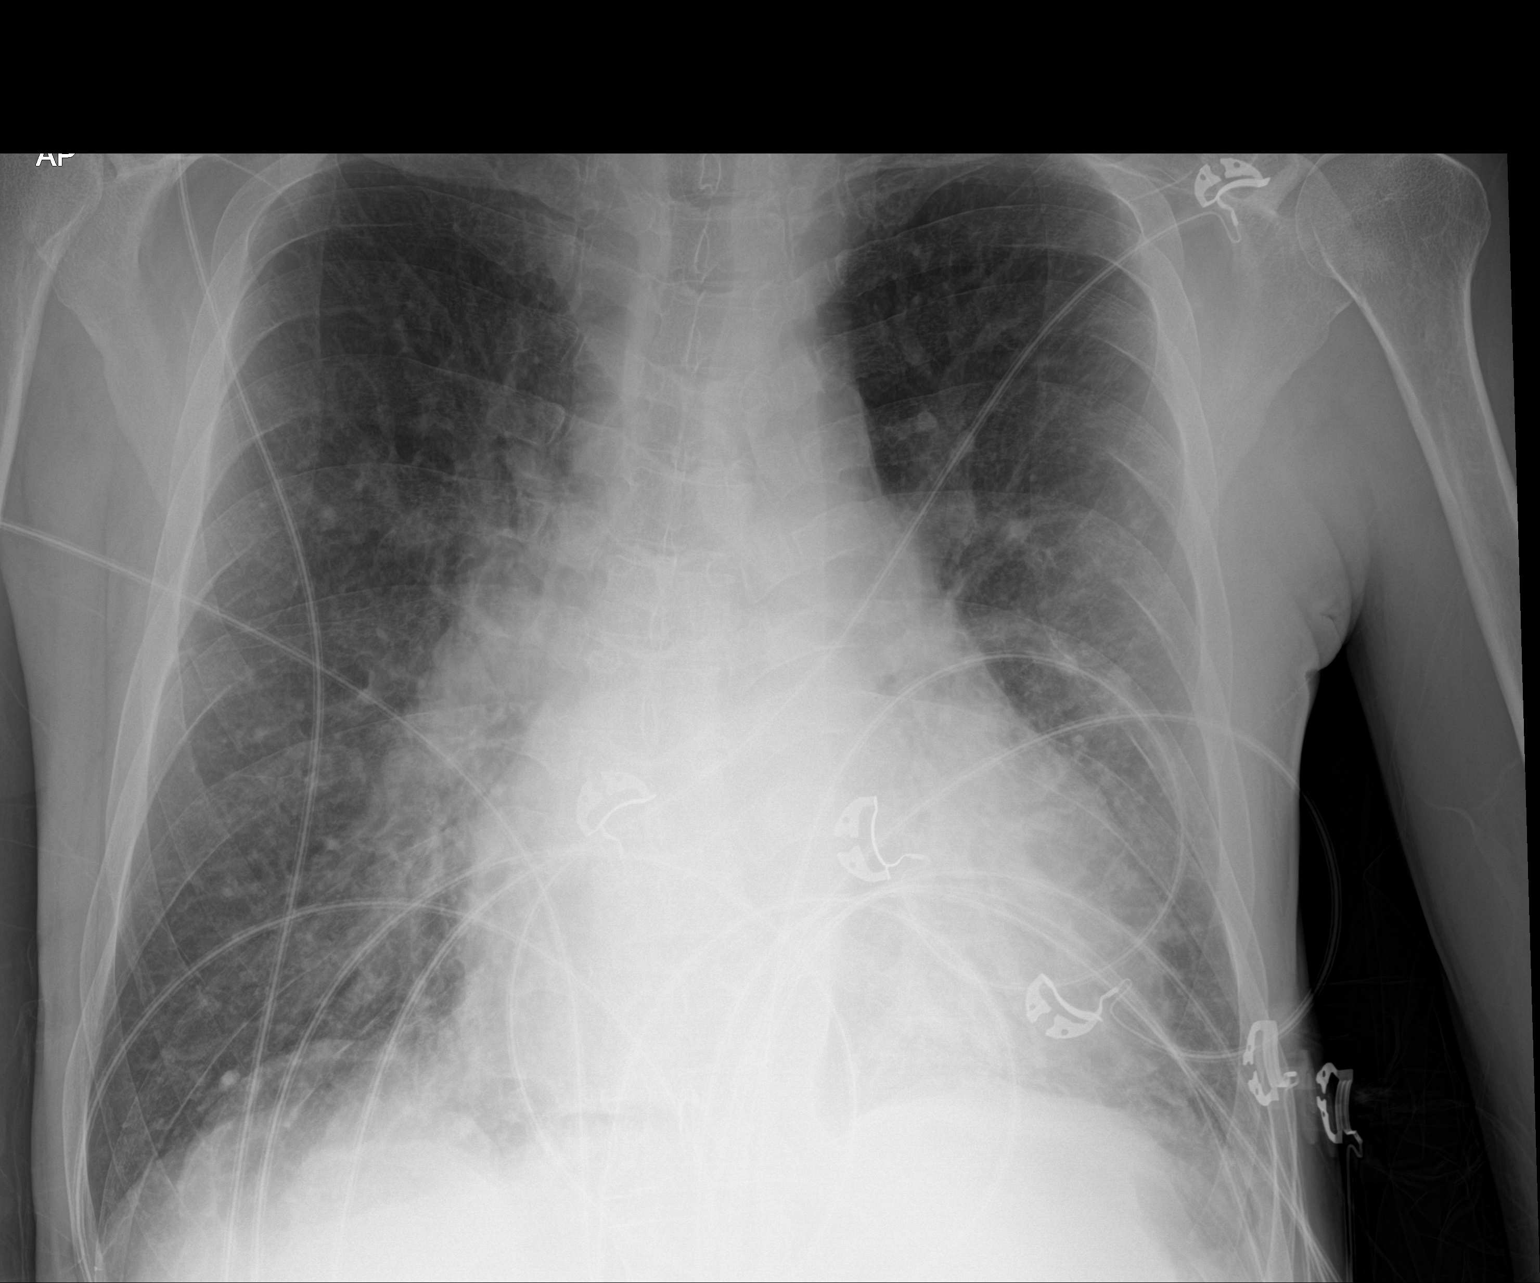

[1 of 1 positions shown; findings below may reference images not displayed]

FINDINGS: Gross cardiomegaly. Pulmonary vascular prominence without overt
edema or focal airspace opacity. The visualized skeletal structures
are unremarkable.
IMPRESSION: Gross cardiomegaly. Pulmonary vascular prominence without overt
edema or focal airspace opacity.

## 2021-07-15 DEATH — deceased
